# Patient Record
Sex: Female | Born: 1960 | Race: White | Hispanic: No | Marital: Married | Smoking: Never smoker
Health system: Southern US, Community
[De-identification: ages and names within clinical notes are randomized; demographics above are authoritative.]

## PROBLEM LIST (undated history)

## (undated) DIAGNOSIS — I10 Essential (primary) hypertension: Secondary | ICD-10-CM

## (undated) DIAGNOSIS — N6019 Diffuse cystic mastopathy of unspecified breast: Secondary | ICD-10-CM

## (undated) DIAGNOSIS — E669 Obesity, unspecified: Secondary | ICD-10-CM

## (undated) DIAGNOSIS — Z1211 Encounter for screening for malignant neoplasm of colon: Secondary | ICD-10-CM

## (undated) HISTORY — DX: Essential (primary) hypertension: I10

## (undated) HISTORY — PX: DILATION AND CURETTAGE OF UTERUS: SHX78

## (undated) HISTORY — DX: Obesity, unspecified: E66.9

## (undated) HISTORY — DX: Diffuse cystic mastopathy of unspecified breast: N60.19

## (undated) HISTORY — PX: TUBAL LIGATION: SHX77

## (undated) HISTORY — DX: Encounter for screening for malignant neoplasm of colon: Z12.11

## (undated) HISTORY — PX: BREAST CYST ASPIRATION: SHX578

---
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
## 1997-05-29 HISTORY — PX: OTHER SURGICAL HISTORY: SHX169

## 2002-04-28 HISTORY — PX: BREAST SURGERY: SHX581

## 2004-07-15 ENCOUNTER — Ambulatory Visit: Payer: Self-pay | Admitting: General Surgery

## 2005-07-27 ENCOUNTER — Ambulatory Visit: Payer: Self-pay | Admitting: General Surgery

## 2006-08-10 ENCOUNTER — Ambulatory Visit: Payer: Self-pay | Admitting: General Surgery

## 2006-08-14 ENCOUNTER — Ambulatory Visit: Payer: Self-pay | Admitting: General Surgery

## 2007-09-10 ENCOUNTER — Ambulatory Visit: Payer: Self-pay | Admitting: General Surgery

## 2008-09-23 ENCOUNTER — Ambulatory Visit: Payer: Self-pay | Admitting: General Surgery

## 2009-01-28 ENCOUNTER — Ambulatory Visit: Payer: Self-pay

## 2009-03-29 HISTORY — PX: ENDOMETRIAL BIOPSY: SHX622

## 2009-09-24 ENCOUNTER — Ambulatory Visit: Payer: Self-pay | Admitting: General Surgery

## 2010-05-29 DIAGNOSIS — N6019 Diffuse cystic mastopathy of unspecified breast: Secondary | ICD-10-CM

## 2010-05-29 HISTORY — DX: Diffuse cystic mastopathy of unspecified breast: N60.19

## 2010-06-23 ENCOUNTER — Encounter: Payer: Self-pay | Admitting: Physician Assistant

## 2010-06-29 ENCOUNTER — Encounter: Payer: Self-pay | Admitting: Physician Assistant

## 2010-07-28 ENCOUNTER — Encounter: Payer: Self-pay | Admitting: Physician Assistant

## 2010-08-28 ENCOUNTER — Encounter: Payer: Self-pay | Admitting: Physician Assistant

## 2010-09-27 ENCOUNTER — Ambulatory Visit: Payer: Self-pay | Admitting: General Surgery

## 2010-12-16 ENCOUNTER — Ambulatory Visit: Payer: Self-pay | Admitting: General Surgery

## 2010-12-16 HISTORY — PX: COLONOSCOPY: SHX174

## 2010-12-19 LAB — PATHOLOGY REPORT

## 2010-12-19 LAB — HM COLONOSCOPY: HM COLON: ABNORMAL — AB

## 2011-05-30 DIAGNOSIS — Z1211 Encounter for screening for malignant neoplasm of colon: Secondary | ICD-10-CM

## 2011-05-30 HISTORY — PX: COLONOSCOPY: SHX174

## 2011-05-30 HISTORY — DX: Encounter for screening for malignant neoplasm of colon: Z12.11

## 2011-09-28 ENCOUNTER — Ambulatory Visit: Payer: Self-pay | Admitting: General Surgery

## 2012-08-13 ENCOUNTER — Encounter: Payer: Self-pay | Admitting: *Deleted

## 2012-08-26 ENCOUNTER — Encounter: Payer: Self-pay | Admitting: General Surgery

## 2012-09-30 ENCOUNTER — Ambulatory Visit: Payer: Self-pay | Admitting: General Surgery

## 2012-10-01 ENCOUNTER — Encounter: Payer: Self-pay | Admitting: General Surgery

## 2012-10-23 ENCOUNTER — Ambulatory Visit: Payer: Self-pay | Admitting: General Surgery

## 2012-11-04 ENCOUNTER — Encounter: Payer: Self-pay | Admitting: General Surgery

## 2012-11-04 ENCOUNTER — Ambulatory Visit (INDEPENDENT_AMBULATORY_CARE_PROVIDER_SITE_OTHER): Payer: 59 | Admitting: General Surgery

## 2012-11-04 VITALS — BP 160/84 | HR 80 | Resp 18 | Ht 62.0 in

## 2012-11-04 DIAGNOSIS — N6019 Diffuse cystic mastopathy of unspecified breast: Secondary | ICD-10-CM

## 2012-11-04 NOTE — Patient Instructions (Addendum)
Patient to return in 1 year with bilateral screening mammogram. Patient to continue self breast checks.

## 2012-11-04 NOTE — Progress Notes (Signed)
Patient ID: Rachel Davidson, female   DOB: 03-08-1961, 52 y.o.   MRN: [id number]  Chief Complaint  Patient presents with  . Follow-up    mammogram follow up    HPI Rachel Davidson is a 52 y.o. female who presents for a follow up mammogram. The most recent mammogram was done on 09/30/12 with a birad category 2. The patient has had prior breast biopsy of the left breast in 2004 that was benign. No known family history of breast problems. The patient does do regular self breast checks and gets regular mammograms done. No new breast problems at this time. The patient has noticed some mild leg edema at times.  HPI  Past Medical History  Diagnosis Date  . Diffuse cystic mastopathy 2012  . Breast screening, unspecified 2012  . Special screening for malignant neoplasms, colon 2013  . Obesity, unspecified   . Screening for obesity 2013    Past Surgical History  Procedure Laterality Date  . Breast surgery Left 2004    biopsy  . Cesarean section  1996  . Tonsillectomy  1979  . Colonoscopy  2013    Dr. Mechele Collin    Family History  Problem Relation Age of Onset  . Cancer Father     lung    Social History History  Substance Use Topics  . Smoking status: Never Smoker   . Smokeless tobacco: Not on file  . Alcohol Use: No    No Known Allergies  Current Outpatient Prescriptions  Medication Sig Dispense Refill  . aspirin 81 MG tablet Take 81 mg by mouth daily.      . DiphenhydrAMINE HCl (BENADRYL ALLERGY PO) Take 1 tablet by mouth daily.       No current facility-administered medications for this visit.    Review of Systems Review of Systems  Constitutional: Negative.   Respiratory: Negative.   Cardiovascular: Negative.     Blood pressure 160/84, pulse 80, resp. rate 18, height 5\' 2"  (1.575 m), last menstrual period 07/12/2011.  Physical Exam Physical Exam  Constitutional: She is oriented to person, place, and time. She appears well-developed and well-nourished.  Eyes:  Conjunctivae are normal. No scleral icterus.  Neck: Trachea normal. No mass and no thyromegaly present.  Cardiovascular: Normal rate, regular rhythm, normal heart sounds and normal pulses.   No murmur heard. Pulses:      Dorsalis pedis pulses are 2+ on the right side, and 2+ on the left side.       Posterior tibial pulses are 2+ on the right side, and 2+ on the left side.  No edema noted at present. No varicose veins.  Pulmonary/Chest: Effort normal and breath sounds normal. Right breast exhibits no inverted nipple, no mass, no nipple discharge, no skin change and no tenderness. Left breast exhibits no inverted nipple, no mass, no nipple discharge, no skin change and no tenderness.  Lymphadenopathy:    She has no cervical adenopathy.    She has no axillary adenopathy.  Neurological: She is alert and oriented to person, place, and time.  Skin: Skin is warm and dry.    Data Reviewed  Mammogram reviewed  Assessment    Exam stable. No findings to account for her ankle edema at present.     Plan    1 year follow up screening bilateral mammogram.        SANKAR,SEEPLAPUTHUR G 11/04/2012, 12:57 PM

## 2013-03-17 ENCOUNTER — Other Ambulatory Visit: Payer: Self-pay | Admitting: Family Medicine

## 2013-03-17 LAB — COMPREHENSIVE METABOLIC PANEL
Albumin: 3.5 g/dL (ref 3.4–5.0)
Anion Gap: 3 — ABNORMAL LOW (ref 7–16)
BUN: 17 mg/dL (ref 7–18)
Creatinine: 0.82 mg/dL (ref 0.60–1.30)
Glucose: 126 mg/dL — ABNORMAL HIGH (ref 65–99)
Osmolality: 281 (ref 275–301)
SGOT(AST): 25 U/L (ref 15–37)
Sodium: 139 mmol/L (ref 136–145)

## 2013-06-11 ENCOUNTER — Ambulatory Visit (INDEPENDENT_AMBULATORY_CARE_PROVIDER_SITE_OTHER): Payer: 59 | Admitting: General Surgery

## 2013-06-11 ENCOUNTER — Encounter: Payer: Self-pay | Admitting: General Surgery

## 2013-06-11 VITALS — BP 152/92 | HR 76 | Temp 97.4°F | Resp 12 | Ht 62.0 in

## 2013-06-11 DIAGNOSIS — K610 Anal abscess: Secondary | ICD-10-CM

## 2013-06-11 DIAGNOSIS — K612 Anorectal abscess: Secondary | ICD-10-CM

## 2013-06-11 MED ORDER — AMOXICILLIN-POT CLAVULANATE 875-125 MG PO TABS: 1.0000 | ORAL_TABLET | Freq: Two times a day (BID) | ORAL | Status: DC

## 2013-06-11 NOTE — Patient Instructions (Signed)
Anal Fistula °An anal fistula is an abnormal tunnel that develops between the bowel and skin near the outside of the anus, where feces comes out. The anus has a number of tiny glands that make lubricating fluid. Sometimes these glands can become plugged and infected. This may lead to the development of a fluid-filled pocket (abscess). An anal fistula often develops after this infection or abscess. It is nearly always caused by a past or current anal abscess.  °CAUSES  °Though an anal fistula is almost always caused by a past or current anal abscess, other causes can include: °· A complication of surgery. °· Trauma to the rectal area. °· Radiation to the area. °· Other medical conditions or diseases, such as:   °· Chronic inflammatory bowel disease, such as Crohn disease or ulcerative colitis.   °· Colon or rectal cancer.   °· Diverticular disease, such as diverticulitis.   °· A sexually transmitted disease, such as gonorrhea, chlamydia, or syphilis. °· An HIV infection or AIDS.   °SYMPTOMS  °· Throbbing or constant pain that may be worse when sitting.   °· Swelling or irritation around the anus.   °· Drainage of pus or blood from an opening near the anus.   °· Pain with bowel movements. °· Fever or chills. °DIAGNOSIS  °Your caregiver will examine the area to find the openings of the anal fistula and the fistula tract. The external opening of the anal fistula may be seen during a physical examination. Other examinations that may be performed include:  °· Examination of the rectal area with a gloved hand (digital rectal exam).   °· Examination with a probe or scope to help locate the internal opening of the fistula.   °· Injection of a dye into the fistula opening. X-rays can be taken to find the exact location and path of the fistula.   °· An MRI or ultrasound of the anal area.   °Other tests may be performed to find the cause of the anal fistula.    °TREATMENT  °The most common treatment for an anal fistula is  surgery. There are different surgery options depending on where your fistula is located and how complex the fistula is. Surgical options include: °· A fistulotomy. This surgery involves opening up the whole fistula and draining the contents inside to promote healing. °· Seton placement. A silk string (seton) is placed into the fistula during a fistulotomy to drain any infection to promote healing. °· Advancement flap procedure. Tissue is removed from your rectum or the skin around the anus and is attached to the opening of the fistula. °· Bioprosthetic plug. A cone-shaped plug is made from your tissue and is used to block the opening of the fistula. °Some anal fistulas do not require surgery. A fibrin glue is a non-surgical option that involves injecting the glue to seal the fistula. You also may be prescribed an antibiotic medicine to treat an infection.  °HOME CARE INSTRUCTIONS  °· Take your antibiotics as directed. Finish them even if you start to feel better. °· Only take over-the-counter or prescription medicines as directed by your caregiver. Use a stool softener or laxative, if recommended.   °· Eat a high-fiber diet to help avoid constipation or as directed by your caregiver. °· Drink enough water to keep your urine clear or pale yellow.   °· A warm sitz bath may be soothing and help with healing. You may take warm sitz baths for 15 20 minutes, 3 4 times a day to ease pain and discomfort.   °· Follow excellent hygiene to keep the   anal area as clean and dry as possible. Use wet toilet paper or moist towelettes after each bowel movement.   °SEEK MEDICAL CARE IF: °You have increased pain not controlled with medicines.  °SEEK IMMEDIATE MEDICAL CARE IF: °· You have severe, intolerable pain. °· You have new swelling, redness, or discharge around the anal area. °· You have tenderness or warmth around the anal area. °· You have chills or diarrhea. °· You have severe problems urinating or having a bowel movement.    °· You have a fever or persistent symptoms for more than 2 3 days.   °· You have a fever and your symptoms suddenly get worse.   °MAKE SURE YOU:  °· Understand these instructions. °· Will watch your condition. °· Will get help right away if you are not doing well or get worse. °Document Released: 04/27/2008 Document Revised: 05/01/2012 Document Reviewed: 03/20/2011 °[employer]® Patient Information ©2014 [employer]. ° °

## 2013-06-11 NOTE — Progress Notes (Signed)
Patient ID: Rachel Davidson, female   DOB: 1960/11/28, 53 y.o.   MRN: [id number]  Chief Complaint  Patient presents with  . Other    boil near anus    HPI Rachel Davidson is a 53 y.o. female.  Here today for evaluation of a possible boil near anus area.  States she noticed it Friday(06/02/13)-it drained some but got worse again today.  HPI  Past Medical History  Diagnosis Date  . Diffuse cystic mastopathy 2012  . Breast screening, unspecified 2012  . Special screening for malignant neoplasms, colon 2013  . Obesity, unspecified   . Screening for obesity 2013    Past Surgical History  Procedure Laterality Date  . Breast surgery Left 2004    biopsy  . Cesarean section  1996  . Tonsillectomy  1979  . Colonoscopy  2013    Dr. Mechele CollinElliott    Family History  Problem Relation Age of Onset  . Cancer Father     lung    Social History History  Substance Use Topics  . Smoking status: Never Smoker   . Smokeless tobacco: Not on file  . Alcohol Use: No    No Known Allergies  Current Outpatient Prescriptions  Medication Sig Dispense Refill  . amoxicillin-clavulanate (AUGMENTIN) 875-125 MG per tablet Take 1 tablet by mouth 2 (two) times daily.  10 tablet  0  . aspirin 81 MG tablet Take 81 mg by mouth daily.      . DiphenhydrAMINE HCl (BENADRYL ALLERGY PO) Take 1 tablet by mouth as needed.        No current facility-administered medications for this visit.    Review of Systems Review of Systems  Constitutional: Negative.   Respiratory: Negative.     Blood pressure 152/92, pulse 76, temperature 97.4 F (36.3 C), temperature source Oral, resp. rate 12, height 5\' 2"  (1.575 m), last menstrual period 07/12/2011.  Physical Exam Physical Exam 2cm raised red, tender area 5 ocl perianal. No drainage. Pustule lkike areas on surface. Data Reviewed    Assessment    Perianal abscess     Plan    Drainage completed with her consent.     Patient was placed in left lateral  position. Anal area prepped with Betadine. 1 mL 1% Xylocaine was instilled. A radial incision was made over the abscess and a small ellipse of skin excised. About 1 mL of pus was evacuated. Dressed with dry gauze. Procedure tolerated with minimal discomfort.  Will recheck in 2-3 weeks. Rx sent for Augmentin 875 mg twice a day for 5 days   SANKAR,SEEPLAPUTHUR G 06/13/2013, 7:58 AM

## 2013-06-12 ENCOUNTER — Other Ambulatory Visit: Payer: Self-pay | Admitting: *Deleted

## 2013-06-12 DIAGNOSIS — K610 Anal abscess: Secondary | ICD-10-CM

## 2013-06-12 MED ORDER — AMOXICILLIN-POT CLAVULANATE 875-125 MG PO TABS: 1.0000 | ORAL_TABLET | Freq: Two times a day (BID) | ORAL | Status: AC

## 2013-06-13 ENCOUNTER — Encounter: Payer: Self-pay | Admitting: General Surgery

## 2013-06-30 ENCOUNTER — Encounter: Payer: Self-pay | Admitting: General Surgery

## 2013-06-30 ENCOUNTER — Ambulatory Visit (INDEPENDENT_AMBULATORY_CARE_PROVIDER_SITE_OTHER): Payer: 59 | Admitting: General Surgery

## 2013-06-30 VITALS — BP 140/82 | HR 72 | Resp 12 | Ht 62.0 in

## 2013-06-30 DIAGNOSIS — K610 Anal abscess: Secondary | ICD-10-CM

## 2013-06-30 DIAGNOSIS — K612 Anorectal abscess: Secondary | ICD-10-CM

## 2013-06-30 NOTE — Progress Notes (Signed)
Patient ID: Rachel Davidson, female   DOB: May 08, 1961, 53 y.o.   MRN: [id number]   The patient presents for a follow up peri anal abscess. The patient states she is doing much better. She denies any drainage at this time.   Patient to return in 1 month for follow up.   Swelling has subsided in the anal area. No active drainage.

## 2013-06-30 NOTE — Patient Instructions (Signed)
Patient to return in 1 month for follow up.  

## 2013-07-29 ENCOUNTER — Encounter: Payer: Self-pay | Admitting: General Surgery

## 2013-07-29 ENCOUNTER — Ambulatory Visit (INDEPENDENT_AMBULATORY_CARE_PROVIDER_SITE_OTHER): Payer: 59 | Admitting: General Surgery

## 2013-07-29 VITALS — BP 142/90 | HR 72 | Resp 12 | Ht 62.0 in

## 2013-07-29 DIAGNOSIS — K612 Anorectal abscess: Secondary | ICD-10-CM

## 2013-07-29 DIAGNOSIS — K610 Anal abscess: Secondary | ICD-10-CM

## 2013-07-29 NOTE — Patient Instructions (Addendum)
Patient to return as scheduled. Call for any problems.

## 2013-07-29 NOTE — Progress Notes (Signed)
Patient ID: Rachel Davidson, female   DOB: 07/27/1960, 53 y.o.   MRN: [id number]   The patient presents for a 1 month follow up perianal abscess. The patient denies any new problems at this time.   Anal exam shows no sign of fistula. No swelling or tenderness. Appears the abscess has resolved fully.

## 2013-11-17 ENCOUNTER — Ambulatory Visit: Payer: Self-pay | Admitting: General Surgery

## 2013-11-18 ENCOUNTER — Encounter: Payer: Self-pay | Admitting: General Surgery

## 2013-11-26 ENCOUNTER — Ambulatory Visit (INDEPENDENT_AMBULATORY_CARE_PROVIDER_SITE_OTHER): Payer: 59 | Admitting: General Surgery

## 2013-11-26 ENCOUNTER — Encounter: Payer: Self-pay | Admitting: General Surgery

## 2013-11-26 VITALS — BP 140/100 | HR 80 | Resp 16 | Ht 62.0 in

## 2013-11-26 DIAGNOSIS — N6019 Diffuse cystic mastopathy of unspecified breast: Secondary | ICD-10-CM

## 2013-11-26 NOTE — Patient Instructions (Signed)
Patient to return in 1 year with bilateral screening mammogram. Continue self breast exams. Call office for any new breast issues or concerns.  

## 2013-11-26 NOTE — Progress Notes (Signed)
Patient ID: Rachel Davidson, female   DOB: [birth year], 53 y.o.   MRN: [id number]  Chief Complaint  Patient presents with  . Follow-up    1 year follow up screening mammogram    HPI Rachel CarinaMarcella K Mcafee is a 53 y.o. female who presents for a breast evaluation. The most recent mammogram was done on 11/17/13. Patient does perform regular self breast checks and gets regular mammograms done. The patient denies any new problems with the breasts at this time.     HPI  Past Medical History  Diagnosis Date  . Diffuse cystic mastopathy 2012  . Breast screening, unspecified 2012  . Special screening for malignant neoplasms, colon 2013  . Obesity, unspecified   . Screening for obesity 2013    Past Surgical History  Procedure Laterality Date  . Breast surgery Left 2004    biopsy  . Cesarean section  1996  . Tonsillectomy  1979  . Colonoscopy  2013    Dr. Mechele CollinElliott    Family History  Problem Relation Age of Onset  . Cancer Father     lung    Social History History  Substance Use Topics  . Smoking status: Never Smoker   . Smokeless tobacco: Not on file  . Alcohol Use: No    No Known Allergies  Current Outpatient Prescriptions  Medication Sig Dispense Refill  . aspirin 81 MG tablet Take 81 mg by mouth daily.      . DiphenhydrAMINE HCl (BENADRYL ALLERGY PO) Take 1 tablet by mouth as needed.        No current facility-administered medications for this visit.    Review of Systems Review of Systems  Constitutional: Negative.   Respiratory: Negative.   Cardiovascular: Negative.     Blood pressure 140/100, pulse 80, resp. rate 16, height 5\' 2"  (1.575 m), weight 0 lb (0 kg), last menstrual period 07/12/2011.  Physical Exam Physical Exam  Constitutional: She is oriented to person, place, and time. She appears well-developed and well-nourished.  Eyes: Conjunctivae are normal. No scleral icterus.  Neck: Neck supple. No thyromegaly present.  Cardiovascular: Normal rate, regular  rhythm and normal heart sounds.   No murmur heard. Pulmonary/Chest: Effort normal and breath sounds normal. Right breast exhibits no inverted nipple, no mass, no nipple discharge, no skin change and no tenderness. Left breast exhibits no inverted nipple, no mass, no nipple discharge, no skin change and no tenderness.  Abdominal: Soft. Normal appearance and bowel sounds are normal. There is no hepatosplenomegaly. There is no tenderness. No hernia.  Lymphadenopathy:    She has no cervical adenopathy.    She has no axillary adenopathy.  Neurological: She is alert and oriented to person, place, and time.  Skin: Skin is warm and dry.    Data Reviewed  Mammogram reviewed and stable.   Assessment    Stable exam. FCD with multiple cyst aspirations in past     Plan    Patient to return in 1 year with bilateral screening mammogram.        Anniebelle Devore G 11/27/2013, 7:17 AM

## 2013-11-27 ENCOUNTER — Encounter: Payer: Self-pay | Admitting: General Surgery

## 2014-03-25 ENCOUNTER — Ambulatory Visit: Payer: Self-pay | Admitting: Family Medicine

## 2014-03-25 LAB — CHOLESTEROL, TOTAL: CHOLESTEROL: 223 mg/dL — AB (ref 0–200)

## 2014-03-25 LAB — HDL CHOLESTEROL: HDL: 47 mg/dL (ref 40–60)

## 2014-03-25 LAB — TSH: Thyroid Stimulating Horm: 3.36 u[IU]/mL

## 2014-03-30 ENCOUNTER — Encounter: Payer: Self-pay | Admitting: General Surgery

## 2014-05-13 ENCOUNTER — Encounter: Payer: Self-pay | Admitting: General Surgery

## 2014-10-07 ENCOUNTER — Other Ambulatory Visit: Payer: Self-pay

## 2014-10-07 DIAGNOSIS — Z1231 Encounter for screening mammogram for malignant neoplasm of breast: Secondary | ICD-10-CM

## 2014-10-28 ENCOUNTER — Ambulatory Visit (INDEPENDENT_AMBULATORY_CARE_PROVIDER_SITE_OTHER): Payer: 59 | Admitting: Family Medicine

## 2014-10-28 ENCOUNTER — Encounter: Payer: Self-pay | Admitting: Family Medicine

## 2014-10-28 VITALS — BP 128/84 | HR 78 | Temp 98.5°F | Resp 14 | Ht 62.0 in | Wt 203.0 lb

## 2014-10-28 DIAGNOSIS — I10 Essential (primary) hypertension: Secondary | ICD-10-CM | POA: Diagnosis not present

## 2014-10-28 DIAGNOSIS — E8881 Metabolic syndrome: Secondary | ICD-10-CM | POA: Insufficient documentation

## 2014-10-28 DIAGNOSIS — G629 Polyneuropathy, unspecified: Secondary | ICD-10-CM | POA: Insufficient documentation

## 2014-10-28 DIAGNOSIS — E669 Obesity, unspecified: Secondary | ICD-10-CM | POA: Insufficient documentation

## 2014-10-28 DIAGNOSIS — N951 Menopausal and female climacteric states: Secondary | ICD-10-CM | POA: Insufficient documentation

## 2014-10-28 NOTE — Patient Instructions (Signed)
RTO IN 6 MO 

## 2014-10-28 NOTE — Progress Notes (Signed)
Name: Rachel Davidson   MRN: [id number]    DOB: 1961/05/22   Date:10/28/2014       Progress Note  Subjective  Chief Complaint  Chief Complaint  Patient presents with  . Hypertension    Hypertension This is a chronic problem. The current episode started more than 1 year ago. The problem is unchanged. The problem is controlled. Associated symptoms include palpitations. Pertinent negatives include no anxiety, blurred vision, chest pain, malaise/fatigue, orthopnea, peripheral edema or shortness of breath. There are no associated agents to hypertension. Risk factors for coronary artery disease include dyslipidemia. Past treatments include diuretics. Compliance problems include exercise.     No problem-specific assessment & plan notes found for this encounter.   Past Medical History  Diagnosis Date  . Diffuse cystic mastopathy 2012  . Breast screening, unspecified 2012  . Special screening for malignant neoplasms, colon 2013  . Obesity, unspecified   . Screening for obesity 2013    Past Surgical History  Procedure Laterality Date  . Breast surgery Left 2004    biopsy  . Cesarean section  1996  . Tonsillectomy  1979  . Colonoscopy  2013    Dr. Mechele CollinElliott    Family History  Problem Relation Age of Onset  . Cancer Father     lung    History   Social History  . Marital Status: Married    Spouse Name: N/A  . Number of Children: N/A  . Years of Education: N/A   Occupational History  . Not on file.   Social History Main Topics  . Smoking status: Never Smoker   . Smokeless tobacco: Not on file  . Alcohol Use: No  . Drug Use: No  . Sexual Activity: Not on file   Other Topics Concern  . Not on file   Social History Narrative     Current outpatient prescriptions:  .  triamterene-hydrochlorothiazide (MAXZIDE-25) 37.5-25 MG per tablet, Take 1 tablet by mouth daily., Disp: , Rfl:  .  aspirin 81 MG tablet, Take 81 mg by mouth daily., Disp: , Rfl:  .  DiphenhydrAMINE  HCl (BENADRYL ALLERGY PO), Take 1 tablet by mouth as needed. , Disp: , Rfl:   No Known Allergies   Review of Systems  Constitutional: Positive for weight loss. Negative for malaise/fatigue.  HENT: Negative for nosebleeds and tinnitus.   Eyes: Negative for blurred vision.  Respiratory: Negative for cough and shortness of breath.   Cardiovascular: Positive for palpitations. Negative for chest pain, orthopnea, claudication and leg swelling.  Skin: Negative for rash.  Neurological: Negative for dizziness and tremors.  Endo/Heme/Allergies: Does not bruise/bleed easily.  Psychiatric/Behavioral: Negative for depression and substance abuse. The patient is not nervous/anxious and does not have insomnia.       Objective  Filed Vitals:   10/28/14 1132  BP: 128/84  Pulse: 78  Temp: 98.5 F (36.9 C)  Resp: 14  Height: 5\' 2"  (1.575 m)  Weight: 203 lb (92.08 kg)  SpO2: 97%    Physical Exam  Constitutional: She is oriented to person, place, and time and well-developed, well-nourished, and in no distress. No distress.  Neck: Neck supple.  Cardiovascular: Normal rate, regular rhythm and normal heart sounds.   Pulses:      Dorsalis pedis pulses are 2+ on the right side, and 2+ on the left side.       Posterior tibial pulses are 2+ on the right side, and 2+ on the left side.  No edema  Pulmonary/Chest: Effort normal and breath sounds normal.  Lymphadenopathy:    She has no cervical adenopathy.  Neurological: She is alert and oriented to person, place, and time.  Skin: Skin is warm and dry. She is not diaphoretic. No erythema.      No results found for this or any previous visit (from the past 2160 hour(s)).   Assessment & Plan  Problem List Items Addressed This Visit    BP (high blood pressure) - Primary   Relevant Medications   triamterene-hydrochlorothiazide (MAXZIDE-25) 37.5-25 MG per tablet   Other Relevant Orders   Comprehensive metabolic panel   TSH      Meds  ordered this encounter  Medications  . triamterene-hydrochlorothiazide (MAXZIDE-25) 37.5-25 MG per tablet    Sig: Take 1 tablet by mouth daily.

## 2014-10-30 ENCOUNTER — Other Ambulatory Visit
Admission: RE | Admit: 2014-10-30 | Discharge: 2014-10-30 | Disposition: A | Discharge status: Home / Self Care | Payer: 59 | Source: Ambulatory Visit | Attending: Family Medicine | Admitting: Family Medicine

## 2014-10-30 DIAGNOSIS — E785 Hyperlipidemia, unspecified: Secondary | ICD-10-CM | POA: Diagnosis not present

## 2014-10-30 DIAGNOSIS — I10 Essential (primary) hypertension: Secondary | ICD-10-CM | POA: Insufficient documentation

## 2014-10-30 LAB — TSH: TSH: 2.771 u[IU]/mL (ref 0.350–4.500)

## 2014-10-30 LAB — COMPREHENSIVE METABOLIC PANEL
ALBUMIN: 4 g/dL (ref 3.5–5.0)
ALK PHOS: 94 U/L (ref 38–126)
ALT: 27 U/L (ref 14–54)
ANION GAP: 11 (ref 5–15)
AST: 25 U/L (ref 15–41)
BILIRUBIN TOTAL: 1.3 mg/dL — AB (ref 0.3–1.2)
BUN: 23 mg/dL — AB (ref 6–20)
CO2: 29 mmol/L (ref 22–32)
CREATININE: 0.91 mg/dL (ref 0.44–1.00)
Calcium: 9.4 mg/dL (ref 8.9–10.3)
Chloride: 101 mmol/L (ref 101–111)
GFR calc Af Amer: >60 mL/min (ref >60)
GFR calc non Af Amer: >60 mL/min (ref >60)
Glucose, Bld: 111 mg/dL — ABNORMAL HIGH (ref 65–99)
Potassium: 3.5 mmol/L (ref 3.5–5.1)
SODIUM: 141 mmol/L (ref 135–145)
TOTAL PROTEIN: 6.9 g/dL (ref 6.5–8.1)

## 2014-11-03 ENCOUNTER — Telehealth: Payer: Self-pay | Admitting: Family Medicine

## 2014-11-03 ENCOUNTER — Encounter: Payer: Self-pay | Admitting: Emergency Medicine

## 2014-11-03 DIAGNOSIS — M79673 Pain in unspecified foot: Secondary | ICD-10-CM

## 2014-11-03 NOTE — Progress Notes (Signed)
Patient walked into office and was notified of lab results

## 2014-11-03 NOTE — Progress Notes (Signed)
Refer to Dr. Ernest PineHooten for foot pain

## 2014-11-03 NOTE — Progress Notes (Signed)
Refer Dr Ernest PineHooten for foot pain

## 2014-11-03 NOTE — Telephone Encounter (Signed)
Pt is checking status on her referral appointment. She is requesting that you try to work on this today because she is in a lot of pain.

## 2014-11-03 NOTE — Progress Notes (Signed)
Patient came by today having right foot and ankle pain. Would like a referral to Dr. Ernest PineHooten

## 2014-11-03 NOTE — Addendum Note (Signed)
Addended by: Dennison MascotMORRISEY, Jacole Capley on: 11/03/2014 10:46 AM   Modules accepted: Orders

## 2014-11-04 NOTE — Telephone Encounter (Signed)
Referral has been made, along with appointment, patient has been notified.

## 2014-11-11 NOTE — Telephone Encounter (Signed)
Errenous °

## 2014-11-16 ENCOUNTER — Ambulatory Visit
Admission: RE | Admit: 2014-11-16 | Discharge: 2014-11-16 | Disposition: A | Discharge status: Home / Self Care | Payer: 59 | Source: Ambulatory Visit | Attending: General Surgery | Admitting: General Surgery

## 2014-11-16 DIAGNOSIS — Z1231 Encounter for screening mammogram for malignant neoplasm of breast: Secondary | ICD-10-CM | POA: Insufficient documentation

## 2014-11-19 ENCOUNTER — Other Ambulatory Visit: Payer: Self-pay | Admitting: Family Medicine

## 2014-11-20 ENCOUNTER — Ambulatory Visit: Payer: Self-pay

## 2014-11-23 ENCOUNTER — Encounter: Payer: Self-pay | Admitting: General Surgery

## 2014-11-23 ENCOUNTER — Ambulatory Visit: Payer: 59 | Admitting: General Surgery

## 2014-11-23 ENCOUNTER — Ambulatory Visit (INDEPENDENT_AMBULATORY_CARE_PROVIDER_SITE_OTHER): Payer: 59 | Admitting: General Surgery

## 2014-11-23 VITALS — BP 126/74 | HR 74 | Resp 12 | Ht 62.0 in

## 2014-11-23 DIAGNOSIS — N6019 Diffuse cystic mastopathy of unspecified breast: Secondary | ICD-10-CM

## 2014-11-23 NOTE — Patient Instructions (Signed)
The patient has been asked to return to the office in one year with a bilateral screening mammogram. 

## 2014-11-23 NOTE — Progress Notes (Signed)
Patient ID: Rachel Davidson, female   DOB: 16-Jul-1960, 54 y.o.   MRN: [id number]  Chief Complaint  Patient presents with  . Follow-up    mammogram    HPI Rachel Davidson is a 54 y.o. female who presents for a breast evaluation. The most recent mammogram was done on 11/18/14.  Patient does perform regular self breast checks and gets regular mammograms done.    HPI  Past Medical History  Diagnosis Date  . Diffuse cystic mastopathy 2012  . Breast screening, unspecified 2012  . Special screening for malignant neoplasms, colon 2013  . Obesity, unspecified   . Screening for obesity 2013    Past Surgical History  Procedure Laterality Date  . Breast surgery Left 2004    biopsy  . Cesarean section  1996  . Tonsillectomy  1979  . Colonoscopy  2013    Dr. Mechele Collin  . Breast biopsy Left     negative 2005  . Breast cyst aspiration Left     2012    Family History  Problem Relation Age of Onset  . Cancer Father     lung    Social History History  Substance Use Topics  . Smoking status: Never Smoker   . Smokeless tobacco: Not on file  . Alcohol Use: No    No Known Allergies  Current Outpatient Prescriptions  Medication Sig Dispense Refill  . aspirin 81 MG tablet Take 81 mg by mouth daily.    . DiphenhydrAMINE HCl (BENADRYL ALLERGY PO) Take 1 tablet by mouth as needed.     . triamterene-hydrochlorothiazide (MAXZIDE-25) 37.5-25 MG per tablet TAKE ONE TABLET BY MOUTH DAILY 90 tablet 1   No current facility-administered medications for this visit.    Review of Systems Review of Systems  Constitutional: Negative.   Respiratory: Negative.   Cardiovascular: Negative.     Blood pressure 126/74, pulse 74, resp. rate 12, height 5\' 2"  (1.575 m), last menstrual period 07/12/2011.  Physical Exam Physical Exam  Constitutional: She appears well-developed and well-nourished.  Eyes: Conjunctivae are normal. No scleral icterus.  Neck: Neck supple.  Cardiovascular: Normal  rate, regular rhythm and normal heart sounds.   Pulmonary/Chest: Effort normal and breath sounds normal. Right breast exhibits no inverted nipple, no mass, no nipple discharge, no skin change and no tenderness. Left breast exhibits no inverted nipple, no mass, no nipple discharge, no skin change and no tenderness.  Abdominal: Bowel sounds are normal. There is no tenderness.  Lymphadenopathy:    She has no cervical adenopathy.    She has no axillary adenopathy.  Neurological: She is alert.  Skin: Skin is warm and dry.    Data Reviewed Mammogram reviewed and stable  Assessment    Fibrocystic disease of breast. History of multiple cyst aspirations in the past. Stable exam and mammogram.      Plan    The patient has been asked to return to the office in one year with a bilateral screening  mammogram.      PCP: Dennison Mascot, MD  Gerlene Burdock G 11/23/2014, 11:31 AM

## 2014-12-02 ENCOUNTER — Ambulatory Visit: Payer: 59 | Admitting: General Surgery

## 2015-05-03 ENCOUNTER — Ambulatory Visit (INDEPENDENT_AMBULATORY_CARE_PROVIDER_SITE_OTHER): Payer: 59 | Admitting: Family Medicine

## 2015-05-03 ENCOUNTER — Encounter: Payer: Self-pay | Admitting: Family Medicine

## 2015-05-03 VITALS — BP 118/78 | HR 103 | Temp 98.1°F | Resp 18 | Ht 62.0 in | Wt 206.5 lb

## 2015-05-03 DIAGNOSIS — E669 Obesity, unspecified: Secondary | ICD-10-CM | POA: Diagnosis not present

## 2015-05-03 DIAGNOSIS — E8881 Metabolic syndrome: Secondary | ICD-10-CM

## 2015-05-03 DIAGNOSIS — I10 Essential (primary) hypertension: Secondary | ICD-10-CM | POA: Diagnosis not present

## 2015-05-03 LAB — GLUCOSE, POCT (MANUAL RESULT ENTRY): POC Glucose: 163 mg/dl — AB (ref 70–99)

## 2015-05-03 LAB — POCT GLYCOSYLATED HEMOGLOBIN (HGB A1C): Hemoglobin A1C: 6

## 2015-05-03 NOTE — Progress Notes (Signed)
Name: Rachel Davidson   MRN: [id number]    DOB: 1961/04/30   Date:05/03/2015       Progress Note  Subjective  Chief Complaint  Chief Complaint  Patient presents with  . Obesity  . Hypertension    HPI  Hypertension   Patient presents for follow-up of hypertension. It has been present for over 5 years.  Patient states that there is compliance with medical regimen which consists of Maxide . There is no end organ disease. Cardiac risk factors include hypertension hyperlipidemia and diabetes.  Exercise regimen consist of some walking .  Diet consist of several junk foods  Obesity  Patient has a history of obesity for over 5 years.  Attempts at weight loss have included diet and exercise and dietary consult in the past .  Results of this regimen  have been less than remarkable .  Patient not seriously interested in weight reduction has gained 3 pounds since last visit. . .  Past Medical History  Diagnosis Date  . Diffuse cystic mastopathy 2012  . Breast screening, unspecified 2012  . Special screening for malignant neoplasms, colon 2013  . Obesity, unspecified   . Screening for obesity 2013    Social History  Substance Use Topics  . Smoking status: Never Smoker   . Smokeless tobacco: Not on file  . Alcohol Use: No     Current outpatient prescriptions:  .  aspirin 81 MG tablet, Take 81 mg by mouth daily., Disp: , Rfl:  .  DiphenhydrAMINE HCl (BENADRYL ALLERGY PO), Take 1 tablet by mouth as needed. , Disp: , Rfl:  .  triamterene-hydrochlorothiazide (MAXZIDE-25) 37.5-25 MG per tablet, TAKE ONE TABLET BY MOUTH DAILY, Disp: 90 tablet, Rfl: 1  No Known Allergies  Review of Systems  Constitutional: Negative for fever, chills and weight loss.       Obese in no acute distress  HENT: Negative for congestion, hearing loss, sore throat and tinnitus.   Eyes: Negative for blurred vision, double vision and redness.  Respiratory: Negative for cough, hemoptysis and shortness of breath.    Cardiovascular: Negative for chest pain, palpitations, orthopnea, claudication and leg swelling.  Gastrointestinal: Negative for heartburn, nausea, vomiting, diarrhea, constipation and blood in stool.  Genitourinary: Negative for dysuria, urgency, frequency and hematuria.  Musculoskeletal: Negative for myalgias, back pain, joint pain, falls and neck pain.  Skin: Negative for itching.  Neurological: Negative for dizziness, tingling, tremors, focal weakness, seizures, loss of consciousness, weakness and headaches.  Endo/Heme/Allergies: Does not bruise/bleed easily.  Psychiatric/Behavioral: Negative for depression and substance abuse. The patient is not nervous/anxious and does not have insomnia.      Objective  Filed Vitals:   05/03/15 0806  BP: 118/78  Pulse: 103  Temp: 98.1 F (36.7 C)  Resp: 18  Height: 5\' 2"  (1.575 m)  Weight: 206 lb 8 oz (93.668 kg)  SpO2: 98%     Physical Exam  Constitutional: She is oriented to person, place, and time.  Obese but in no acute distress  HENT:  Head: Normocephalic.  Eyes: EOM are normal. Pupils are equal, round, and reactive to light.  Neck: Normal range of motion. No thyromegaly present.  Cardiovascular: Normal rate, regular rhythm and normal heart sounds.   No murmur heard. Pulmonary/Chest: Effort normal and breath sounds normal.  Musculoskeletal: Normal range of motion. She exhibits no edema.  Neurological: She is alert and oriented to person, place, and time. No cranial nerve deficit. Gait normal.  Skin: Skin is warm  and dry. No rash noted.  Psychiatric: Memory and affect normal.      Assessment & Plan  1. Essential hypertension Well-controlled  2. Dysmetabolic syndrome Check glucose and A1c - POCT HgB A1C - POCT Glucose (CBG) - Comprehensive Metabolic Panel (CMET) - Lipid Profile - TSH  3. 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

## 2015-05-04 ENCOUNTER — Other Ambulatory Visit
Admission: RE | Admit: 2015-05-04 | Discharge: 2015-05-04 | Disposition: A | Discharge status: Home / Self Care | Payer: 59 | Source: Ambulatory Visit | Attending: Family Medicine | Admitting: Family Medicine

## 2015-05-04 DIAGNOSIS — E8881 Metabolic syndrome: Secondary | ICD-10-CM | POA: Insufficient documentation

## 2015-05-04 LAB — LIPID PANEL
CHOLESTEROL: 207 mg/dL — AB (ref 0–200)
HDL: 49 mg/dL (ref >40)
LDL Cholesterol: 137 mg/dL — ABNORMAL HIGH (ref 0–99)
Total CHOL/HDL Ratio: 4.2 RATIO
Triglycerides: 106 mg/dL (ref <150)
VLDL: 21 mg/dL (ref 0–40)

## 2015-05-04 LAB — TSH: TSH: 2.286 u[IU]/mL (ref 0.350–4.500)

## 2015-05-04 LAB — COMPREHENSIVE METABOLIC PANEL
ALT: 32 U/L (ref 14–54)
AST: 22 U/L (ref 15–41)
Albumin: 4.2 g/dL (ref 3.5–5.0)
Alkaline Phosphatase: 87 U/L (ref 38–126)
Anion gap: 7 (ref 5–15)
BILIRUBIN TOTAL: 1.6 mg/dL — AB (ref 0.3–1.2)
BUN: 30 mg/dL — AB (ref 6–20)
CALCIUM: 8.8 mg/dL — AB (ref 8.9–10.3)
CO2: 29 mmol/L (ref 22–32)
CREATININE: 0.78 mg/dL (ref 0.44–1.00)
Chloride: 99 mmol/L — ABNORMAL LOW (ref 101–111)
GFR calc Af Amer: >60 mL/min (ref >60)
Glucose, Bld: 119 mg/dL — ABNORMAL HIGH (ref 65–99)
Potassium: 3.6 mmol/L (ref 3.5–5.1)
Sodium: 135 mmol/L (ref 135–145)
TOTAL PROTEIN: 7 g/dL (ref 6.5–8.1)

## 2015-05-10 DIAGNOSIS — I10 Essential (primary) hypertension: Secondary | ICD-10-CM | POA: Insufficient documentation

## 2015-06-28 DIAGNOSIS — Z124 Encounter for screening for malignant neoplasm of cervix: Secondary | ICD-10-CM | POA: Diagnosis not present

## 2015-06-28 DIAGNOSIS — Z01419 Encounter for gynecological examination (general) (routine) without abnormal findings: Secondary | ICD-10-CM | POA: Diagnosis not present

## 2015-06-28 DIAGNOSIS — N912 Amenorrhea, unspecified: Secondary | ICD-10-CM | POA: Diagnosis not present

## 2015-06-28 LAB — HM PAP SMEAR: HM PAP: NORMAL

## 2015-08-31 ENCOUNTER — Other Ambulatory Visit: Payer: Self-pay | Admitting: *Deleted

## 2015-08-31 DIAGNOSIS — Z1231 Encounter for screening mammogram for malignant neoplasm of breast: Secondary | ICD-10-CM

## 2015-09-02 ENCOUNTER — Encounter: Payer: Self-pay | Admitting: *Deleted

## 2015-11-01 ENCOUNTER — Ambulatory Visit: Payer: 59 | Admitting: Family Medicine

## 2015-11-17 ENCOUNTER — Other Ambulatory Visit: Payer: Self-pay | Admitting: General Surgery

## 2015-11-17 ENCOUNTER — Ambulatory Visit
Admission: RE | Admit: 2015-11-17 | Discharge: 2015-11-17 | Disposition: A | Discharge status: Home / Self Care | Payer: 59 | Source: Ambulatory Visit | Attending: General Surgery | Admitting: General Surgery

## 2015-11-17 DIAGNOSIS — Z1231 Encounter for screening mammogram for malignant neoplasm of breast: Secondary | ICD-10-CM | POA: Insufficient documentation

## 2015-11-25 ENCOUNTER — Ambulatory Visit: Payer: 59 | Admitting: General Surgery

## 2015-12-02 ENCOUNTER — Encounter: Payer: Self-pay | Admitting: General Surgery

## 2015-12-02 ENCOUNTER — Ambulatory Visit (INDEPENDENT_AMBULATORY_CARE_PROVIDER_SITE_OTHER): Payer: 59 | Admitting: General Surgery

## 2015-12-02 VITALS — BP 130/66 | HR 72 | Resp 14 | Ht 62.0 in

## 2015-12-02 DIAGNOSIS — R222 Localized swelling, mass and lump, trunk: Secondary | ICD-10-CM | POA: Diagnosis not present

## 2015-12-02 DIAGNOSIS — N6019 Diffuse cystic mastopathy of unspecified breast: Secondary | ICD-10-CM | POA: Diagnosis not present

## 2015-12-02 NOTE — Patient Instructions (Signed)
Continue self breast exams. Call office for any new breast issues or concerns. 

## 2015-12-02 NOTE — Progress Notes (Signed)
Patient ID: Rachel Davidson, female   DOB: 1961-03-15, 55 y.o.   MRN: [id number]  Chief Complaint  Patient presents with  . Follow-up    mammogram    HPI Rachel Davidson is a 55 y.o. female who presents for a breast evaluation. The most recent mammogram was done on 11/17/15 . Patient does perform regular self breast checks and gets regular mammograms done.  No new breast issues.  I have reviewed the history of present illness with the patient.   HPI  Past Medical History  Diagnosis Date  . Diffuse cystic mastopathy 2012  . Breast screening, unspecified 2012  . Special screening for malignant neoplasms, colon 2013  . Obesity, unspecified   . Screening for obesity 2013    Past Surgical History  Procedure Laterality Date  . Breast surgery Left 2004    biopsy  . Cesarean section  1996  . Tonsillectomy  1979  . Colonoscopy  2013    Dr. Mechele CollinElliott  . Breast biopsy Left     negative 2005  . Breast cyst aspiration Left     2012    Family History  Problem Relation Age of Onset  . Cancer Father     lung  . Breast cancer Neg Hx     Social History Social History  Substance Use Topics  . Smoking status: Never Smoker   . Smokeless tobacco: None  . Alcohol Use: No    No Known Allergies  Current Outpatient Prescriptions  Medication Sig Dispense Refill  . aspirin 81 MG tablet Take 81 mg by mouth daily.    . DiphenhydrAMINE HCl (BENADRYL ALLERGY PO) Take 1 tablet by mouth as needed.     . triamterene-hydrochlorothiazide (MAXZIDE-25) 37.5-25 MG per tablet TAKE ONE TABLET BY MOUTH DAILY 90 tablet 1   No current facility-administered medications for this visit.    Review of Systems Review of Systems  Constitutional: Negative.   Respiratory: Negative.   Cardiovascular: Negative.     Blood pressure 130/66, pulse 72, resp. rate 14, height 5\' 2"  (1.575 m), last menstrual period 07/12/2011.  Physical Exam Physical Exam  Constitutional: She is oriented to person, place,  and time. She appears well-developed and well-nourished.  Eyes: Conjunctivae are normal. No scleral icterus.  Neck: Neck supple. No thyromegaly present.  Cardiovascular: Normal rate, regular rhythm and normal heart sounds.   Pulmonary/Chest: Effort normal and breath sounds normal. Right breast exhibits no inverted nipple, no mass, no nipple discharge, no skin change and no tenderness. Left breast exhibits no inverted nipple, no mass, no nipple discharge, no skin change and no tenderness.  Abdominal: Soft. Bowel sounds are normal.  Lymphadenopathy:    She has no cervical adenopathy.    She has no axillary adenopathy.  Neurological: She is alert and oriented to person, place, and time.  Skin: Skin is warm and dry.       Data Reviewed Mammogram reviewed and stable  Assessment    Fibrocystic disease Mass chest wall     Plan   Recommended excision of chest wall mass- can be done here in office with local anesthesia.  Follow-up in one year with bilateral mammogram and office visit. Continue with monthly self-breast exams.      This information has been scribed by Milas Kocherebeca Morris, CMA   PCP: Dr. Lanette HampshireSowles   SANKAR,SEEPLAPUTHUR G 12/07/2015, 8:05 AM

## 2015-12-07 ENCOUNTER — Encounter: Payer: Self-pay | Admitting: General Surgery

## 2015-12-08 ENCOUNTER — Ambulatory Visit (INDEPENDENT_AMBULATORY_CARE_PROVIDER_SITE_OTHER): Payer: 59 | Admitting: Family Medicine

## 2015-12-08 ENCOUNTER — Encounter: Payer: Self-pay | Admitting: Family Medicine

## 2015-12-08 VITALS — BP 138/82 | HR 78 | Temp 99.4°F | Resp 16 | Ht 62.0 in | Wt 202.8 lb

## 2015-12-08 DIAGNOSIS — R202 Paresthesia of skin: Secondary | ICD-10-CM | POA: Diagnosis not present

## 2015-12-08 DIAGNOSIS — E8881 Metabolic syndrome: Secondary | ICD-10-CM | POA: Diagnosis not present

## 2015-12-08 DIAGNOSIS — Z1159 Encounter for screening for other viral diseases: Secondary | ICD-10-CM

## 2015-12-08 DIAGNOSIS — E669 Obesity, unspecified: Secondary | ICD-10-CM | POA: Diagnosis not present

## 2015-12-08 DIAGNOSIS — Z23 Encounter for immunization: Secondary | ICD-10-CM

## 2015-12-08 DIAGNOSIS — I10 Essential (primary) hypertension: Secondary | ICD-10-CM | POA: Diagnosis not present

## 2015-12-08 DIAGNOSIS — E785 Hyperlipidemia, unspecified: Secondary | ICD-10-CM

## 2015-12-08 MED ORDER — TRIAMTERENE-HCTZ 37.5-25 MG PO TABS: 1.0000 | ORAL_TABLET | Freq: Every day | ORAL | Status: DC

## 2015-12-08 NOTE — Progress Notes (Signed)
Name: Rachel Davidson   MRN: [id number]    DOB: Aug 18, 1960   Date:12/08/2015       Progress Note  Subjective  Chief Complaint  Chief Complaint  Patient presents with  . Medication Refill    6 month F/U   . Hypertension    Edema in bilateral legs, checks BP at work and gets 140/70's   . Allergic Rhinitis     Bendaryl as needed and controls symptoms as well.     HPI  HTN: she forgets to take her bp medication at times, but she took this am. She denies chest pain, edema or SOB  Metabolic Syndrome: last hgbA1C was 6.0% six months ago, she denies polyphagia, polydipsia or polyuria. She takes baby aspirin a few days weekly  Obesity: she joined a boot camp but only lost 8 lbs and got frustrated about it. She took this Summer off from activity but will go back in the Fall. Discussed dietary modification also.   AR: takes benadryl prn, usually rhinorrhea, post-nasal drainage and nasal congestion but doing well at this time.    Patient Active Problem List   Diagnosis Date Noted  . Essential hypertension 05/10/2015  . Dysmetabolic syndrome 10/28/2014  . Neuropathy (HCC) 10/28/2014  . Adiposity 10/28/2014  . Symptomatic menopausal or female climacteric states 10/28/2014  . Fibrocystic breast 11/04/2012    Past Surgical History  Procedure Laterality Date  . Breast surgery Left 2004    biopsy  . Cesarean section  1996  . Tonsillectomy  1979  . Colonoscopy  2013    Dr. Mechele Collin  . Breast biopsy Left     negative 2005  . Breast cyst aspiration Left     2012    Family History  Problem Relation Age of Onset  . Cancer Father     lung  . Breast cancer Neg Hx     Social History   Social History  . Marital Status: Married    Spouse Name: N/A  . Number of Children: N/A  . Years of Education: N/A   Occupational History  . Not on file.   Social History Main Topics  . Smoking status: Never Smoker   . Smokeless tobacco: Never Used  . Alcohol Use: No  . Drug Use: No  .  Sexual Activity:    Partners: Male   Other Topics Concern  . Not on file   Social History Narrative     Current outpatient prescriptions:  .  aspirin 81 MG tablet, Take 81 mg by mouth daily., Disp: , Rfl:  .  DiphenhydrAMINE HCl (BENADRYL ALLERGY PO), Take 1 tablet by mouth as needed. , Disp: , Rfl:  .  triamterene-hydrochlorothiazide (MAXZIDE-25) 37.5-25 MG tablet, Take 1 tablet by mouth daily., Disp: 90 tablet, Rfl: 1  No Known Allergies   ROS  Constitutional: Negative for fever or significant weight change.  Respiratory: Negative for cough and shortness of breath.   Cardiovascular: Negative for chest pain or palpitations.  Gastrointestinal: Negative for abdominal pain, no bowel changes.  Musculoskeletal: Negative for gait problem or joint swelling.  Skin: Negative for rash.  Neurological: Negative for dizziness or headache.  No other specific complaints in a complete review of systems (except as listed in HPI above). Paresthesia on her feet when wearing shoes at work, but sometimes numbness on right half of right foot  Objective  Filed Vitals:   12/08/15 0945 12/08/15 0958  BP:  138/82  Pulse: 78   Temp: 99.4 F (  37.4 C)   TempSrc: Oral   Resp: 16   Height: 5\' 2"  (1.575 m)   Weight: 202 lb 12.8 oz (91.989 kg)   SpO2: 97%     Body mass index is 37.08 kg/(m^2).  Physical Exam  Constitutional: Patient appears well-developed and well-nourished. Obese  No distress.  HEENT: head atraumatic, normocephalic, pupils equal and reactive to light, neck supple, throat within normal limits Cardiovascular: Normal rate, regular rhythm and normal heart sounds.  No murmur heard. No BLE edema. Pulmonary/Chest: Effort normal and breath sounds normal. No respiratory distress. Abdominal: Soft.  There is no tenderness. Psychiatric: Patient has a normal mood and affect. behavior is normal. Judgment and thought content normal. Neurological: normal exam, has intermittent  paresthesia  PHQ2/9: Depression screen PHQ 2/9 12/08/2015  Decreased Interest 0  Down, Depressed, Hopeless 0  PHQ - 2 Score 0     Fall Risk: Fall Risk  12/08/2015  Falls in the past year? No     Functional Status Survey: Is the patient deaf or have difficulty hearing?: No Does the patient have difficulty seeing, even when wearing glasses/contacts?: No Does the patient have difficulty concentrating, remembering, or making decisions?: No Does the patient have difficulty walking or climbing stairs?: No Does the patient have difficulty dressing or bathing?: No Does the patient have difficulty doing errands alone such as visiting a doctor's office or shopping?: No    Assessment & Plan  1. Essential hypertension  - Comprehensive metabolic panel - triamterene-hydrochlorothiazide (MAXZIDE-25) 37.5-25 MG tablet; Take 1 tablet by mouth daily.  Dispense: 90 tablet; Refill: 1  2. Dysmetabolic syndrome  - Hemoglobin A1c  3. Obesity  Discussed with the patient the risk posed by an increased BMI. Discussed importance of portion control, calorie counting and at least 150 minutes of physical activity weekly. Avoid sweet beverages and drink more water. Eat at least 6 servings of fruit and vegetables daily   4. Dyslipidemia  - Lipid panel  5. Need for Tdap vaccination  - Tdap vaccine greater than or equal to 7yo IM  6. Need for hepatitis C screening test  - Hepatitis C antibody  7. Paresthesia of both feet  Discussed EMG/NCS but she wants to hold off , she denies back problems - Vitamin B12

## 2015-12-09 ENCOUNTER — Other Ambulatory Visit
Admission: RE | Admit: 2015-12-09 | Discharge: 2015-12-09 | Disposition: A | Discharge status: Home / Self Care | Payer: 59 | Source: Ambulatory Visit | Attending: Family Medicine | Admitting: Family Medicine

## 2015-12-09 DIAGNOSIS — Z1159 Encounter for screening for other viral diseases: Secondary | ICD-10-CM | POA: Diagnosis not present

## 2015-12-09 DIAGNOSIS — E785 Hyperlipidemia, unspecified: Secondary | ICD-10-CM | POA: Insufficient documentation

## 2015-12-09 DIAGNOSIS — E8881 Metabolic syndrome: Secondary | ICD-10-CM | POA: Insufficient documentation

## 2015-12-09 LAB — COMPREHENSIVE METABOLIC PANEL
ALBUMIN: 4.3 g/dL (ref 3.5–5.0)
ALK PHOS: 100 U/L (ref 38–126)
ALT: 25 U/L (ref 14–54)
AST: 20 U/L (ref 15–41)
Anion gap: 8 (ref 5–15)
BUN: 30 mg/dL — AB (ref 6–20)
CALCIUM: 9.2 mg/dL (ref 8.9–10.3)
CO2: 28 mmol/L (ref 22–32)
CREATININE: 0.89 mg/dL (ref 0.44–1.00)
Chloride: 101 mmol/L (ref 101–111)
GFR calc Af Amer: >60 mL/min (ref >60)
GFR calc non Af Amer: >60 mL/min (ref >60)
GLUCOSE: 107 mg/dL — AB (ref 65–99)
Potassium: 3.5 mmol/L (ref 3.5–5.1)
SODIUM: 137 mmol/L (ref 135–145)
Total Bilirubin: 1.6 mg/dL — ABNORMAL HIGH (ref 0.3–1.2)
Total Protein: 7.1 g/dL (ref 6.5–8.1)

## 2015-12-09 LAB — LIPID PANEL
CHOLESTEROL: 240 mg/dL — AB (ref 0–200)
HDL: 59 mg/dL (ref >40)
LDL Cholesterol: 161 mg/dL — ABNORMAL HIGH (ref 0–99)
TRIGLYCERIDES: 102 mg/dL (ref <150)
Total CHOL/HDL Ratio: 4.1 RATIO
VLDL: 20 mg/dL (ref 0–40)

## 2015-12-09 LAB — VITAMIN B12: Vitamin B-12: 349 pg/mL (ref 180–914)

## 2015-12-09 LAB — HEMOGLOBIN A1C: Hgb A1c MFr Bld: 6 % (ref 4.0–6.0)

## 2016-01-24 ENCOUNTER — Encounter: Payer: Self-pay | Admitting: Family Medicine

## 2016-01-26 ENCOUNTER — Telehealth: Payer: Self-pay | Admitting: Family Medicine

## 2016-01-26 NOTE — Telephone Encounter (Signed)
Pap was put in patient chart.

## 2016-06-12 ENCOUNTER — Ambulatory Visit (INDEPENDENT_AMBULATORY_CARE_PROVIDER_SITE_OTHER): Payer: 59 | Admitting: Family Medicine

## 2016-06-12 ENCOUNTER — Encounter: Payer: Self-pay | Admitting: Family Medicine

## 2016-06-12 VITALS — BP 136/84 | HR 70 | Temp 98.1°F | Resp 16 | Ht 62.0 in | Wt 211.5 lb

## 2016-06-12 DIAGNOSIS — R7989 Other specified abnormal findings of blood chemistry: Secondary | ICD-10-CM | POA: Diagnosis not present

## 2016-06-12 DIAGNOSIS — E538 Deficiency of other specified B group vitamins: Secondary | ICD-10-CM

## 2016-06-12 DIAGNOSIS — Z1159 Encounter for screening for other viral diseases: Secondary | ICD-10-CM | POA: Diagnosis not present

## 2016-06-12 DIAGNOSIS — G629 Polyneuropathy, unspecified: Secondary | ICD-10-CM | POA: Diagnosis not present

## 2016-06-12 DIAGNOSIS — I10 Essential (primary) hypertension: Secondary | ICD-10-CM | POA: Diagnosis not present

## 2016-06-12 DIAGNOSIS — E785 Hyperlipidemia, unspecified: Secondary | ICD-10-CM

## 2016-06-12 DIAGNOSIS — E8881 Metabolic syndrome: Secondary | ICD-10-CM

## 2016-06-12 MED ORDER — TRIAMTERENE-HCTZ 37.5-25 MG PO TABS: 1.0000 | ORAL_TABLET | Freq: Every day | ORAL | 1 refills | Status: DC

## 2016-06-12 NOTE — Progress Notes (Signed)
Name: Rachel Davidson   MRN: [id number]    DOB: 1961-04-26   Date:06/12/2016       Progress Note  Subjective  Chief Complaint  Chief Complaint  Patient presents with  . Medication Refill    6 month F/U  . 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  She denies chest pain, or SOB. Mild lower extremity edema at the end of her shift  Metabolic Syndrome: last hgbA1C was 6.0% six months ago, she denies polyphagia, polydipsia or polyuria. She takes baby aspirin a few days weekly  Obesity:she has gained weight again through the holidays.   AR: takes benadryl prn, usually rhinorrhea, post-nasal drainage and nasal congestion but doing well at this time.    Patient Active Problem List   Diagnosis Date Noted  . Essential hypertension 05/10/2015  . Dysmetabolic syndrome 10/28/2014  . Neuropathy (HCC) 10/28/2014  . Adiposity 10/28/2014  . Symptomatic menopausal or female climacteric states 10/28/2014  . Fibrocystic breast 11/04/2012    Past Surgical History:  Procedure Laterality Date  . BREAST BIOPSY Left    negative 2005  . BREAST CYST ASPIRATION Left    2012  . BREAST SURGERY Left 2004   biopsy  . CESAREAN SECTION  1996  . COLONOSCOPY  2013   Dr. Mechele CollinElliott  . TONSILLECTOMY  1979    Family History  Problem Relation Age of Onset  . Cancer Father     lung  . Breast cancer Neg Hx     Social History   Social History  . Marital status: Married    Spouse name: N/A  . Number of children: N/A  . Years of education: N/A   Occupational History  . Not on file.   Social History Main Topics  . Smoking status: Never Smoker  . Smokeless tobacco: Never Used  . Alcohol use No  . Drug use: No  . Sexual activity: Yes    Partners: Male   Other Topics Concern  . Not on file   Social History Narrative  . No narrative on file     Current Outpatient Prescriptions:  .  aspirin 81 MG tablet, Take 81  mg by mouth daily., Disp: , Rfl:  .  DiphenhydrAMINE HCl (BENADRYL ALLERGY PO), Take 1 tablet by mouth as needed. , Disp: , Rfl:  .  ibuprofen (ADVIL,MOTRIN) 200 MG tablet, Take by mouth., Disp: , Rfl:  .  triamterene-hydrochlorothiazide (MAXZIDE-25) 37.5-25 MG tablet, Take 1 tablet by mouth daily., Disp: 90 tablet, Rfl: 1  No Known Allergies   ROS  Constitutional: Negative for fever , positive for weight change.  Respiratory: Negative for cough and shortness of breath.   Cardiovascular: Negative for chest pain or palpitations.  Gastrointestinal: Negative for abdominal pain, no bowel changes.  Musculoskeletal: Negative for gait problem or joint swelling.  Skin: Negative for rash.  Neurological: Negative for dizziness or headache.  No other specific complaints in a complete review of systems (except as listed in HPI above).  Objective  Vitals:   06/12/16 0817  BP: 136/84  Pulse: 70  Resp: 16  Temp: 98.1 F (36.7 C)  TempSrc: Oral  SpO2: 96%  Weight: 211 lb 8 oz (95.9 kg)  Height: 5\' 2"  (1.575 m)    Body mass index is 38.68 kg/m.  Physical Exam  Constitutional: Patient appears well-developed and well-nourished. Obese  No distress.  HEENT:  head atraumatic, normocephalic, pupils equal and reactive to light, neck supple, throat within normal limits Cardiovascular: Normal rate, regular rhythm and normal heart sounds.  No murmur heard. No BLE edema. Pulmonary/Chest: Effort normal and breath sounds normal. No respiratory distress. Abdominal: Soft.  There is no tenderness. Psychiatric: Patient has a normal mood and affect. behavior is normal. Judgment and thought content normal.  PHQ2/9: Depression screen [facility] 2/9 06/12/2016 12/08/2015  Decreased Interest 0 0  Down, Depressed, Hopeless 0 0  PHQ - 2 Score 0 0     Fall Risk: Fall Risk  06/12/2016 12/08/2015  Falls in the past year? No No     Functional Status Survey: Is the patient deaf or have difficulty hearing?:  No Does the patient have difficulty seeing, even when wearing glasses/contacts?: No Does the patient have difficulty concentrating, remembering, or making decisions?: No Does the patient have difficulty walking or climbing stairs?: No Does the patient have difficulty dressing or bathing?: No Does the patient have difficulty doing errands alone such as visiting a doctor's office or shopping?: No    Assessment & Plan  1. Essential hypertension  - COMPLETE METABOLIC PANEL WITH GFR  2. Dysmetabolic syndrome  - Hemoglobin A1c - Insulin, fasting  3. Dyslipidemia  - Lipid panel  4. Need for hepatitis C screening test  - Hepatitis C antibody  5. Neuropathy (HCC)  Right lateral foot, stable  6. Low serum vitamin B12  - Vitamin B12

## 2016-06-28 DIAGNOSIS — Z01419 Encounter for gynecological examination (general) (routine) without abnormal findings: Secondary | ICD-10-CM | POA: Diagnosis not present

## 2016-06-28 DIAGNOSIS — Z124 Encounter for screening for malignant neoplasm of cervix: Secondary | ICD-10-CM | POA: Diagnosis not present

## 2016-06-29 DIAGNOSIS — Z124 Encounter for screening for malignant neoplasm of cervix: Secondary | ICD-10-CM | POA: Diagnosis not present

## 2016-06-29 DIAGNOSIS — Z01419 Encounter for gynecological examination (general) (routine) without abnormal findings: Secondary | ICD-10-CM | POA: Diagnosis not present

## 2016-09-07 ENCOUNTER — Other Ambulatory Visit: Payer: Self-pay

## 2016-09-07 DIAGNOSIS — Z1231 Encounter for screening mammogram for malignant neoplasm of breast: Secondary | ICD-10-CM

## 2016-11-22 ENCOUNTER — Ambulatory Visit: Payer: 59 | Admitting: General Surgery

## 2016-11-22 ENCOUNTER — Ambulatory Visit
Admission: RE | Admit: 2016-11-22 | Discharge: 2016-11-22 | Disposition: A | Discharge status: Home / Self Care | Payer: 59 | Source: Ambulatory Visit | Attending: General Surgery | Admitting: General Surgery

## 2016-11-22 DIAGNOSIS — Z1231 Encounter for screening mammogram for malignant neoplasm of breast: Secondary | ICD-10-CM | POA: Insufficient documentation

## 2016-11-27 ENCOUNTER — Telehealth: Payer: Self-pay | Admitting: General Surgery

## 2016-11-27 NOTE — Telephone Encounter (Signed)
LEFT MESSAGE ON [facility]# TO CALL IN & RESCHEDULE APPOINTMENT 12-11-16 TO ANOTHER DAY.(DR Evette CristalSANKAR OUT OF THE [facility])

## 2016-12-11 ENCOUNTER — Ambulatory Visit: Payer: 59 | Admitting: General Surgery

## 2016-12-18 ENCOUNTER — Ambulatory Visit: Payer: 59 | Admitting: General Surgery

## 2016-12-20 ENCOUNTER — Encounter: Payer: Self-pay | Admitting: General Surgery

## 2016-12-20 ENCOUNTER — Ambulatory Visit (INDEPENDENT_AMBULATORY_CARE_PROVIDER_SITE_OTHER): Payer: 59 | Admitting: General Surgery

## 2016-12-20 VITALS — BP 124/82 | HR 64 | Resp 14 | Ht 62.0 in

## 2016-12-20 DIAGNOSIS — Z872 Personal history of diseases of the skin and subcutaneous tissue: Secondary | ICD-10-CM

## 2016-12-20 NOTE — Progress Notes (Signed)
Patient ID: Rachel CarinaMarcella K Cacioppo, female   DOB: Jan 31, 1961, 56 y.o.   MRN: [id number]  Chief Complaint  Patient presents with  . Follow-up    HPI Rachel Davidson is a 56 y.o. female who presents for a breast evaluation. The most recent mammogram was done on 11-22-16.  Patient does perform regular self breast checks and gets regular mammograms done.   No new breast issues.  HPI  Past Medical History:  Diagnosis Date  . Breast screening, unspecified 2012  . Diffuse cystic mastopathy 2012  . Obesity, unspecified   . Screening for obesity 2013  . Special screening for malignant neoplasms, colon 2013    Past Surgical History:  Procedure Laterality Date  . BREAST BIOPSY Left    negative 2005  . BREAST CYST ASPIRATION Left    2012  . BREAST SURGERY Left 2004   biopsy  . CESAREAN SECTION  1996  . COLONOSCOPY  2013   Dr. Mechele CollinElliott  . TONSILLECTOMY  1979    Family History  Problem Relation Age of Onset  . Cancer Father        lung  . Breast cancer Neg Hx     Social History Social History  Substance Use Topics  . Smoking status: Never Smoker  . Smokeless tobacco: Never Used  . Alcohol use No    No Known Allergies  Current Outpatient Prescriptions  Medication Sig Dispense Refill  . aspirin 81 MG tablet Take 81 mg by mouth daily.    . DiphenhydrAMINE HCl (BENADRYL ALLERGY PO) Take 1 tablet by mouth as needed.     Marland Kitchen. ibuprofen (ADVIL,MOTRIN) 200 MG tablet Take by mouth.    . triamterene-hydrochlorothiazide (MAXZIDE-25) 37.5-25 MG tablet Take 1 tablet by mouth daily. 90 tablet 1   No current facility-administered medications for this visit.     Review of Systems Review of Systems  Constitutional: Negative.   Respiratory: Negative.   Cardiovascular: Negative.     Blood pressure 124/82, pulse 64, resp. rate 14, height 5\' 2"  (1.575 m), last menstrual period 07/12/2011.  Physical Exam Physical Exam  Constitutional: She is oriented to person, place, and time. She  appears well-developed and well-nourished.  Eyes: Conjunctivae are normal. No scleral icterus.  Neck: Normal range of motion. Neck supple.  Cardiovascular: Normal rate and regular rhythm.   Pulmonary/Chest: Effort normal and breath sounds normal. Right breast exhibits no inverted nipple, no mass, no nipple discharge, no skin change and no tenderness. Left breast exhibits no inverted nipple, no mass, no nipple discharge, no skin change and no tenderness. Breasts are symmetrical.  Abdominal: Soft. Bowel sounds are normal. There is no tenderness.  Lymphadenopathy:    She has no cervical adenopathy.    She has no axillary adenopathy.  Neurological: She is alert and oriented to person, place, and time.  Skin: Skin is warm and dry.    Data Reviewed Mammogram, prior note  Assessment   fibrocystic breast disease, hx of breast cysts - stable exam, mammogram is benign      Plan    Return to PCP/gyn for mammograms and breast checks     HPI, Physical Exam, Assessment and Plan have been scribed under the direction and in the presence of Kathreen CosierS. G. Jabriel Vanduyne, MD Dorathy DaftMarsha Hatch, RN  I have completed the exam and reviewed the above documentation for accuracy and completeness.  I agree with the above.  [employer] has been used and any errors in dictation or transcription are unintentional.  Yitzchak Kothari  Wynona LunaG. Cheick Suhr, M.D., F.A.C.S.  Gerlene BurdockSANKAR,Jacqulene Huntley G 12/20/2016, 11:01 AM

## 2016-12-20 NOTE — Patient Instructions (Signed)
Return to PCP for mammograms and breast checks

## 2017-02-08 DIAGNOSIS — H5203 Hypermetropia, bilateral: Secondary | ICD-10-CM | POA: Diagnosis not present

## 2018-01-18 ENCOUNTER — Encounter

## 2018-01-21 ENCOUNTER — Encounter: Payer: Self-pay | Admitting: Family Medicine

## 2018-01-21 ENCOUNTER — Ambulatory Visit (INDEPENDENT_AMBULATORY_CARE_PROVIDER_SITE_OTHER): Payer: 59 | Admitting: Family Medicine

## 2018-01-21 VITALS — BP 134/78 | HR 83 | Temp 98.6°F | Resp 14 | Ht 62.0 in | Wt 215.6 lb

## 2018-01-21 DIAGNOSIS — Z1231 Encounter for screening mammogram for malignant neoplasm of breast: Secondary | ICD-10-CM | POA: Diagnosis not present

## 2018-01-21 DIAGNOSIS — G629 Polyneuropathy, unspecified: Secondary | ICD-10-CM

## 2018-01-21 DIAGNOSIS — E8881 Metabolic syndrome: Secondary | ICD-10-CM

## 2018-01-21 DIAGNOSIS — E538 Deficiency of other specified B group vitamins: Secondary | ICD-10-CM | POA: Diagnosis not present

## 2018-01-21 DIAGNOSIS — Z1159 Encounter for screening for other viral diseases: Secondary | ICD-10-CM | POA: Diagnosis not present

## 2018-01-21 DIAGNOSIS — Z23 Encounter for immunization: Secondary | ICD-10-CM | POA: Diagnosis not present

## 2018-01-21 DIAGNOSIS — I10 Essential (primary) hypertension: Secondary | ICD-10-CM | POA: Diagnosis not present

## 2018-01-21 DIAGNOSIS — Z0000 Encounter for general adult medical examination without abnormal findings: Secondary | ICD-10-CM | POA: Diagnosis not present

## 2018-01-21 DIAGNOSIS — M25562 Pain in left knee: Secondary | ICD-10-CM

## 2018-01-21 DIAGNOSIS — E785 Hyperlipidemia, unspecified: Secondary | ICD-10-CM | POA: Diagnosis not present

## 2018-01-21 DIAGNOSIS — Z1239 Encounter for other screening for malignant neoplasm of breast: Secondary | ICD-10-CM

## 2018-01-21 DIAGNOSIS — Z01419 Encounter for gynecological examination (general) (routine) without abnormal findings: Secondary | ICD-10-CM

## 2018-01-21 MED ORDER — TRIAMTERENE-HCTZ 37.5-25 MG PO TABS: 1.0000 | ORAL_TABLET | Freq: Every day | ORAL | 1 refills | Status: DC

## 2018-01-21 NOTE — Progress Notes (Signed)
Name: Rachel Davidson   MRN: [id number]    DOB: 1960/12/06   Date:01/21/2018       Progress Note  Subjective  Chief Complaint  Chief Complaint  Patient presents with  . 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  She denies chest pain, palpitation or SOB. Mild lower extremity edema at the end of her shift.   Metabolic Syndrome: last XNAT5T was 6.0% but has not been seen in over one year and did not have labs done since 2017. She polyphagia, polydipsia or polyuria.   Obesity:she has gained a little weight since last visit, she states she does not like sweets but likes food, chips and bread. She wants to make the decision of changing life style on her own.   AR: takes claritin prn and is doing well at this time, no nasal congestion or rhinorrhea.   Dyslipidemia: we will recheck labs.   Left knee intermittent pain: she states left knee feels tight, sometimes achy and occasionally pops and causes a more intense  pain, taking ibuprofen more often. It started about one month ago. Sometimes catches   Diet: high dairy, also fruit and vegetables, but also likes eating out and bread. Needs to work on portion control  Exercise: walking but not enough   USPSTF grade A and B recommendations   Depression:  Depression screen [facility] 2/9 01/21/2018 06/12/2016 12/08/2015  Decreased Interest 0 0 0  Down, Depressed, Hopeless 0 0 0  PHQ - 2 Score 0 0 0   Hypertension: BP Readings from Last 3 Encounters:  01/21/18 134/78  12/20/16 124/82  06/12/16 136/84   Obesity: Wt Readings from Last 3 Encounters:  01/21/18 215 lb 9.6 oz (97.8 kg)  06/12/16 211 lb 8 oz (95.9 kg)  12/08/15 202 lb 12.8 oz (92 kg)   BMI Readings from Last 3 Encounters:  01/21/18 39.43 kg/m  12/20/16 38.68 kg/m  06/12/16 38.68 kg/m    Hep C Screening: today  STD testing and prevention (HIV/chl/gon/syphilis): N/A Intimate  partner violence: negative  Sexual History/Pain during Intercourse: Menstrual History/LMP/Abnormal Bleeding: Incontinence Symptoms:   Advanced Care Planning: A voluntary discussion about advance care planning including the explanation and discussion of advance directives.  Discussed health care proxy and Living will, and the patient was able to identify a health care proxy as Mckinsley Koelzer .  Patient does not have a living will at present time. If patient does have living will, I have requested they bring this to the clinic to be scanned in to their chart.  Breast cancer: due for mammogram  BRCA gene screening: N/A Cervical cancer screening: sees GYN   Osteoporosis Screening:  Discussed bone density and she wants to hold off for now.   Lipids:  Lab Results  Component Value Date   CHOL 240 (H) 12/09/2015   CHOL 207 (H) 05/04/2015   CHOL 223 (H) 03/25/2014   Lab Results  Component Value Date   HDL 59 12/09/2015   HDL 49 05/04/2015   HDL 47 03/25/2014   Lab Results  Component Value Date   LDLCALC 161 (H) 12/09/2015   LDLCALC 137 (H) 05/04/2015   Lab Results  Component Value Date   TRIG 102 12/09/2015   TRIG 106 05/04/2015   Lab Results  Component Value Date   CHOLHDL 4.1 12/09/2015   CHOLHDL 4.2 05/04/2015  No results found for: LDLDIRECT  Glucose:  Glucose  Date Value Ref Range Status  03/17/2013 126 (H) 65 - 99 mg/dL Final   Glucose, Bld  Date Value Ref Range Status  12/09/2015 107 (H) 65 - 99 mg/dL Final  05/04/2015 119 (H) 65 - 99 mg/dL Final  10/30/2014 111 (H) 65 - 99 mg/dL Final    Skin cancer:  Discussed atypical lesions  Colorectal cancer: up to date  Lung cancer:  Low Dose CT Chest recommended if Age 3-80 years, 30 pack-year currently smoking OR have quit w/in 15years. Patient does not qualify.   ECG: today   Patient Active Problem List   Diagnosis Date Noted  . B12 deficiency 01/21/2018  . Essential hypertension 05/10/2015  . Dysmetabolic  syndrome 60/60/0459  . Neuropathy 10/28/2014  . Obesity (BMI 30-39.9) 10/28/2014  . Symptomatic menopausal or female climacteric states 10/28/2014  . Fibrocystic breast 11/04/2012    Past Surgical History:  Procedure Laterality Date  . BREAST BIOPSY Left    negative 2005  . BREAST CYST ASPIRATION Left    2012  . BREAST SURGERY Left 2004   biopsy  . CESAREAN SECTION  1996  . CESAREAN SECTION  1999  . COLONOSCOPY  2013   Dr. Vira Agar  . TONSILLECTOMY  1979    Family History  Problem Relation Age of Onset  . Cancer Father        lung  . Breast cancer Neg Hx     Social History   Socioeconomic History  . Marital status: Married    Spouse name: Not on file  . Number of children: 2  . Years of education: Not on file  . Highest education level: Associate degree: academic program  Occupational History  . Not on file  Social Needs  . Financial resource strain: Not hard at all  . Food insecurity:    Worry: Never true    Inability: Never true  . Transportation needs:    Medical: No    Non-medical: No  Tobacco Use  . Smoking status: Never Smoker  . Smokeless tobacco: Never Used  Substance and Sexual Activity  . Alcohol use: No    Alcohol/week: 0.0 standard drinks  . Drug use: No  . Sexual activity: Yes    Partners: Male  Lifestyle  . Physical activity:    Days per week: 3 days    Minutes per session: 30 min  . Stress: Not at all  Relationships  . Social connections:    Talks on phone: More than three times a week    Gets together: More than three times a week    Attends religious service: More than 4 times per year    Active member of club or organization: No    Attends meetings of clubs or organizations: Never    Relationship status: Married  . Intimate partner violence:    Fear of current or ex partner: No    Emotionally abused: No    Physically abused: No    Forced sexual activity: No  Other Topics Concern  . 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  ibuprofen (ADVIL,MOTRIN) 200 MG tablet, Take by mouth., Disp: , Rfl:  .  loratadine (CLARITIN) 10 MG tablet, Take 10 mg by mouth daily., Disp: , Rfl:  .  DiphenhydrAMINE HCl (BENADRYL ALLERGY PO), Take  1 tablet by mouth as needed. , Disp: , Rfl:  .  triamterene-hydrochlorothiazide (MAXZIDE-25) 37.5-25 MG tablet, Take 1 tablet by mouth daily., Disp: 90 tablet, Rfl: 1  No Known Allergies   ROS  Constitutional: Negative for fever or significant  weight change.  Respiratory: Negative for cough and shortness of breath.   Cardiovascular: Negative for chest pain or palpitations.  Gastrointestinal: Negative for abdominal pain, no bowel changes.  Musculoskeletal: positive  for gait problem or joint swelling.  Skin: Negative for rash.  Neurological: Negative for dizziness or headache.  No other specific complaints in a complete review of systems (except as listed in HPI above).  Objective  Vitals:   01/21/18 1404  BP: 134/78  Pulse: 83  Resp: 14  Temp: 98.6 F (37 C)  TempSrc: Oral  SpO2: 99%  Weight: 215 lb 9.6 oz (97.8 kg)  Height: 5' 2"  (1.575 m)    Body mass index is 39.43 kg/m.  Physical Exam  Constitutional: Patient appears well-developed and obese . No distress.  HENT: Head: Normocephalic and atraumatic. Ears: B TMs ok, no erythema or effusion; Nose: Nose normal. Mouth/Throat: Oropharynx is clear and moist. No oropharyngeal exudate.  Eyes: Conjunctivae and EOM are normal. Pupils are equal, round, and reactive to light. No scleral icterus.  Neck: Normal range of motion. Neck supple. No JVD present. No thyromegaly present.  Cardiovascular: Normal rate, regular rhythm and normal heart sounds.  No murmur heard. No BLE edema. Pulmonary/Chest: Effort normal and breath sounds normal. No respiratory distress. Abdominal: Soft. Bowel sounds are normal, no distension. There  is no tenderness. no masses Breast: not done  FEMALE GENITALIA:  Not done RECTAL: not done Musculoskeletal: Normal range of motion, no joint effusions.Pain on left lateral aspect of anterior knee, no crepitus , positive mcMurray sign, discussed possible meniscal tear and she will try losing weight and follow up with Ortho if no improvement  Neurological: he is alert and oriented to person, place, and time. No cranial nerve deficit. Coordination, balance, strength, speech and gait are normal.  Skin: Skin is warm and dry. No rash noted. No erythema.  Psychiatric: Patient has a normal mood and affect. behavior is normal. Judgment and thought content normal.  PHQ2/9: Depression screen [facility] 2/9 01/21/2018 06/12/2016 12/08/2015  Decreased Interest 0 0 0  Down, Depressed, Hopeless 0 0 0  PHQ - 2 Score 0 0 0    Fall Risk: Fall Risk  01/21/2018 06/12/2016 12/08/2015  Falls in the past year? No No No    Functional Status Survey: Is the patient deaf or have difficulty hearing?: No Does the patient have difficulty seeing, even when wearing glasses/contacts?: Yes Does the patient have difficulty concentrating, remembering, or making decisions?: No Does the patient have difficulty walking or climbing stairs?: No Does the patient have difficulty dressing or bathing?: No Does the patient have difficulty doing errands alone such as visiting a doctor's office or shopping?: No   Assessment & Plan  1. Well woman exam  She will follow up with gyn for pap and breast exam   2. Dysmetabolic syndrome  - Hemoglobin A1c  3. Dyslipidemia  - Lipid panel  4. Need for hepatitis C screening test  - Hepatitis C Antibody  5. Neuropathy  - CBC with Differential/Platelet - COMPLETE METABOLIC PANEL WITH GFR  6. Low serum vitamin B12  - Vitamin B12  7. Essential hypertension  - CBC with Differential/Platelet - COMPLETE METABOLIC PANEL WITH GFR - triamterene-hydrochlorothiazide (MAXZIDE-25) 37.5-25  MG  tablet; Take 1 tablet by mouth daily.  Dispense: 90 tablet; Refill: 1 -EKG   8. Breast cancer screening  - MM DIGITAL SCREENING BILATERAL; Future   10. Left anterior knee pain  Discussed possible meniscal tear    -USPSTF grade A and B recommendations reviewed with patient; age-appropriate recommendations, preventive care, screening tests, etc discussed and encouraged; healthy living encouraged; see AVS for patient education given to patient -Discussed importance of 150 minutes of physical activity weekly, eat two servings of fish weekly, eat one serving of tree nuts ( cashews, pistachios, pecans, almonds.Marland Kitchen) every other day, eat 6 servings of fruit/vegetables daily and drink plenty of water and avoid sweet beverages.

## 2018-01-22 LAB — LIPID PANEL
Cholesterol: 199 mg/dL (ref <200)
HDL: 49 mg/dL — ABNORMAL LOW (ref >50)
LDL Cholesterol (Calc): 109 mg/dL (calc) — ABNORMAL HIGH
NON-HDL CHOLESTEROL (CALC): 150 mg/dL — AB (ref <130)
Total CHOL/HDL Ratio: 4.1 (calc) (ref <5.0)
Triglycerides: 286 mg/dL — ABNORMAL HIGH (ref <150)

## 2018-01-22 LAB — CBC WITH DIFFERENTIAL/PLATELET
BASOS PCT: 0.8 %
Basophils Absolute: 74 cells/uL (ref 0–200)
EOS ABS: 129 {cells}/uL (ref 15–500)
EOS PCT: 1.4 %
HCT: 43.6 % (ref 35.0–45.0)
HEMOGLOBIN: 14.7 g/dL (ref 11.7–15.5)
Lymphs Abs: 2282 cells/uL (ref 850–3900)
MCH: 28.7 pg (ref 27.0–33.0)
MCHC: 33.7 g/dL (ref 32.0–36.0)
MCV: 85 fL (ref 80.0–100.0)
MONOS PCT: 8.8 %
MPV: 12.2 fL (ref 7.5–12.5)
NEUTROS ABS: 5906 {cells}/uL (ref 1500–7800)
Neutrophils Relative %: 64.2 %
PLATELETS: 237 10*3/uL (ref 140–400)
RBC: 5.13 10*6/uL — AB (ref 3.80–5.10)
RDW: 13.4 % (ref 11.0–15.0)
TOTAL LYMPHOCYTE: 24.8 %
WBC mixed population: 810 cells/uL (ref 200–950)
WBC: 9.2 10*3/uL (ref 3.8–10.8)

## 2018-01-22 LAB — HEMOGLOBIN A1C
EAG (MMOL/L): 7.3 (calc)
HEMOGLOBIN A1C: 6.2 %{Hb} — AB (ref <5.7)
MEAN PLASMA GLUCOSE: 131 (calc)

## 2018-01-22 LAB — COMPLETE METABOLIC PANEL WITH GFR
AG RATIO: 2.2 (calc) (ref 1.0–2.5)
ALKALINE PHOSPHATASE (APISO): 97 U/L (ref 33–130)
ALT: 23 U/L (ref 6–29)
AST: 18 U/L (ref 10–35)
Albumin: 4.3 g/dL (ref 3.6–5.1)
BILIRUBIN TOTAL: 1 mg/dL (ref 0.2–1.2)
BUN: 19 mg/dL (ref 7–25)
CALCIUM: 9.3 mg/dL (ref 8.6–10.4)
CHLORIDE: 103 mmol/L (ref 98–110)
CO2: 31 mmol/L (ref 20–32)
Creat: 0.9 mg/dL (ref 0.50–1.05)
GFR, EST NON AFRICAN AMERICAN: 71 mL/min/{1.73_m2} (ref >=60)
GFR, Est African American: 82 mL/min/{1.73_m2} (ref >=60)
Globulin: 2 g/dL (calc) (ref 1.9–3.7)
Glucose, Bld: 107 mg/dL (ref 65–139)
Potassium: 3.6 mmol/L (ref 3.5–5.3)
Sodium: 142 mmol/L (ref 135–146)
Total Protein: 6.3 g/dL (ref 6.1–8.1)

## 2018-01-22 LAB — VITAMIN B12: VITAMIN B 12: 532 pg/mL (ref 200–1100)

## 2018-01-22 LAB — HEPATITIS C ANTIBODY
HEP C AB: NONREACTIVE
SIGNAL TO CUT-OFF: 0.01 (ref <1.00)

## 2018-03-05 ENCOUNTER — Ambulatory Visit (INDEPENDENT_AMBULATORY_CARE_PROVIDER_SITE_OTHER): Payer: 59 | Admitting: Certified Nurse Midwife

## 2018-03-05 ENCOUNTER — Encounter: Payer: Self-pay | Admitting: Certified Nurse Midwife

## 2018-03-05 VITALS — BP 126/80 | HR 64 | Ht 62.0 in

## 2018-03-05 DIAGNOSIS — Z01411 Encounter for gynecological examination (general) (routine) with abnormal findings: Secondary | ICD-10-CM | POA: Diagnosis not present

## 2018-03-05 DIAGNOSIS — R232 Flushing: Secondary | ICD-10-CM | POA: Diagnosis not present

## 2018-03-05 DIAGNOSIS — N951 Menopausal and female climacteric states: Secondary | ICD-10-CM

## 2018-03-05 DIAGNOSIS — Z1239 Encounter for other screening for malignant neoplasm of breast: Secondary | ICD-10-CM | POA: Diagnosis not present

## 2018-03-05 DIAGNOSIS — Z01419 Encounter for gynecological examination (general) (routine) without abnormal findings: Secondary | ICD-10-CM

## 2018-03-05 NOTE — Progress Notes (Signed)
Gynecology Annual Exam  PCP: Alba Cory, MD  Chief Complaint:  Chief Complaint  Patient presents with  . Gynecologic Exam    History of Present Illness:Rachel Davidson presents today for her annual exam. She is a 57 year old Caucasian/White female , G 4 P 2 0 2 2. She is having no significant GYN problems.  Her menses are absent and she is postmenopausal. Her LMP was around 11/2013.  She has had no spotting. She does have occasional hot flashes  The patient's past medical history is notable for obesity and hypertension (takes Maxide). Her PCP is Dr Carlynn Purl.  Since her last annual GYN exam dated 06/28/2016, she has had no significant changes in her health history.  She is sexually active. She does not have vaginal dryness.   Her most recent pap smear was obtained 06/28/2016 and was NIL. There is no history of abnormal Pap smears Her most recent mammogram obtained 11/22/2016 and was normal. She has a hx of fibrocystic changes and Dr Evette Cristal followed her for her mammograms and exams prior to his retirement. There is no family history of breast cancer. There is no family history of ovarian cancer. The patient does do monthly self breast exams.  She had a colonoscopy in 2012 that was normal. Her next colonoscopy is due in 10 years.  A DEXA scan is not applicable for this patient.  The patient does not smoke.  The patient does not drink alcohol.  The patient does not use illegal drugs.  The patient is exercising by walking. The patient may get adequate calcium in her diet.  She had a recent cholesterol screen in 2019 by Dr Carlynn Purl that was remarkable for elevated triglycerides. Her hemoglobin A1C was 6.2%.    Review of Systems: Review of Systems  Constitutional: Negative for chills, fever and weight loss.  HENT: Negative for congestion, sinus pain and sore throat.   Eyes: Negative for blurred vision and pain.  Respiratory: Negative for hemoptysis, shortness of breath and  wheezing.   Cardiovascular: Negative for chest pain, palpitations and leg swelling.  Gastrointestinal: Negative for abdominal pain, blood in stool, diarrhea, heartburn, nausea and vomiting.  Genitourinary: Negative for dysuria, frequency, hematuria and urgency.  Musculoskeletal: Positive for joint pain (left knee). Negative for back pain and myalgias.  Skin: Negative for itching and rash.  Neurological: Negative for dizziness, tingling and headaches.  Endo/Heme/Allergies: Negative for environmental allergies and polydipsia. Does not bruise/bleed easily.       Positive for hot flashes   Psychiatric/Behavioral: Negative for depression. The patient is not nervous/anxious and does not have insomnia.     Past Medical History:  Past Medical History:  Diagnosis Date  . Diffuse cystic mastopathy 2012  . Hypertension   . Obesity, unspecified   . Special screening for malignant neoplasms, colon 2013    Past Surgical History:  Past Surgical History:  Procedure Laterality Date  . BREAST CYST ASPIRATION Left 1990s and 2012   Dr Evette Cristal  . BREAST SURGERY Left 04/2002   biopsy-benign  . CESAREAN SECTION  1996  . CESAREAN SECTION and Bilateral Tubal Ligation  1999  . COLONOSCOPY  2013   Dr. Mechele Collin  . DILATION AND CURETTAGE OF UTERUS  1995/ 1985   blighted ovum-CAK and incomplete AB-Dr CAK  . ENDOMETRIAL BIOPSY  03/2009   proliferative  . TONSILLECTOMY  1979    Family History:  Family History  Problem Relation Age of Onset  . Cancer Father  lung  . Diabetes Father   . Heart disease Father   . Hypertension Father   . Hypertension Sister   . Hypertension Brother   . Prostate cancer Maternal Grandfather   . Breast cancer Neg Hx     Social History:  Social History   Socioeconomic History  . Marital status: Married    Spouse name: Not on file  . Number of children: 2  . Years of education: Not on file  . Highest education level: Associate degree: academic program    Occupational History  . Occupation: RN-surgical floor at [employer]  Social Needs  . Financial resource strain: Not hard at all  . Food insecurity:    Worry: Never true    Inability: Never true  . Transportation needs:    Medical: No    Non-medical: No  Tobacco Use  . Smoking status: Never Smoker  . Smokeless tobacco: Never Used  Substance and Sexual Activity  . Alcohol use: No    Alcohol/week: 0.0 standard drinks  . Drug use: No  . Sexual activity: Yes    Partners: Male    Birth control/protection: Post-menopausal  Lifestyle  . Physical activity:    Days per week: 3 days    Minutes per session: 30 min  . Stress: Not at all  Relationships  . Social connections:    Talks on phone: More than three times a week    Gets together: More than three times a week    Attends religious service: More than 4 times per year    Active member of club or organization: No    Attends meetings of clubs or organizations: Never    Relationship status: Married  . Intimate partner violence:    Fear of current or ex partner: No    Emotionally abused: No    Physically abused: No    Forced sexual activity: No  Other Topics Concern  . 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 Authorizing Provider  DiphenhydrAMINE HCl (BENADRYL ALLERGY PO) Take 1 tablet by mouth as needed.     [provider]  ibuprofen (ADVIL,MOTRIN) 200 MG tablet Take by mouth.    [provider]  loratadine (CLARITIN) 10 MG tablet Take 10 mg by mouth daily.    [provider]  triamterene-hydrochlorothiazide (MAXZIDE-25) 37.5-25 MG tablet Take 1 tablet by mouth daily. 01/21/18   Alba Cory, MD    Physical Exam Vitals:BP 126/80   Pulse 64   Ht 5\' 2"  (1.575 m)   LMP 07/12/2011 Comment: Pt states she only  weighs at PCP.  BMI 39.43 kg/m   General: WF in NAD HEENT: normocephalic, anicteric Neck: no thyroid enlargement, no palpable nodules, no cervical lymphadenopathy  Pulmonary: No increased work of breathing, CTAB Cardiovascular: RRR, without murmur  Breast: Breast symmetrical, no tenderness, no palpable nodules or masses, no skin or nipple retraction present, no nipple discharge.  No axillary, infraclavicular or supraclavicular lymphadenopathy. Abdomen: Soft, obese,non-tender, non-distended.  Umbilicus without lesions.  No hepatomegaly or masses palpable. No evidence of hernia. Genitourinary:  External: Normal external female genitalia.  Normal urethral meatus, normal Bartholin's and Skene's glands.    Vagina: Normal vaginal mucosa, no evidence of prolapse.    Cervix:  no bleeding, non-tender  Uterus: Anteverted, small,  mobile, and non-tender  Adnexa: No adnexal masses, non-tender  Rectal: deferred  Lymphatic: no evidence of inguinal lymphadenopathy Extremities: no edema, erythema, or tenderness Neurologic: Grossly intact Psychiatric: mood appropriate, affect full     Assessment: 57 y.o. [id number] normal postmenopausal gyn exam Vasomotor symptoms  Plan:     1) Breast cancer screening - recommend monthly self breast exam and annual screening mammograms Mammogram was ordered today. Patient to schedule [facility]  2) Colon cancer screening: colonoscopy 2012-next due 2022.  3) Cervical cancer screening - Pap not done. ASCCP guidelines and rational discussed.  Patient opts for every 2-3 years screening interval  4) Routine healthcare maintenance including cholesterol and diabetes screening managed by PCP  Discussed OTC treatments for vasomotor symptoms  5) RTO 1 year and prn  Farrel Conners, CNM

## 2018-03-25 ENCOUNTER — Ambulatory Visit
Admission: RE | Admit: 2018-03-25 | Discharge: 2018-03-25 | Disposition: A | Discharge status: Home / Self Care | Payer: 59 | Source: Ambulatory Visit | Attending: Certified Nurse Midwife | Admitting: Certified Nurse Midwife

## 2018-03-25 DIAGNOSIS — Z1231 Encounter for screening mammogram for malignant neoplasm of breast: Secondary | ICD-10-CM | POA: Diagnosis not present

## 2018-03-25 DIAGNOSIS — Z1239 Encounter for other screening for malignant neoplasm of breast: Secondary | ICD-10-CM | POA: Insufficient documentation

## 2018-05-13 DIAGNOSIS — H5203 Hypermetropia, bilateral: Secondary | ICD-10-CM | POA: Diagnosis not present

## 2018-09-02 ENCOUNTER — Ambulatory Visit (INDEPENDENT_AMBULATORY_CARE_PROVIDER_SITE_OTHER): Payer: No Typology Code available for payment source | Admitting: Family Medicine

## 2018-09-02 ENCOUNTER — Encounter: Payer: Self-pay | Admitting: Family Medicine

## 2018-09-02 ENCOUNTER — Other Ambulatory Visit: Payer: Self-pay

## 2018-09-02 VITALS — BP 140/90 | HR 75 | Temp 98.0°F | Resp 16 | Ht 62.0 in | Wt 208.0 lb

## 2018-09-02 DIAGNOSIS — E8881 Metabolic syndrome: Secondary | ICD-10-CM | POA: Diagnosis not present

## 2018-09-02 DIAGNOSIS — I10 Essential (primary) hypertension: Secondary | ICD-10-CM

## 2018-09-02 DIAGNOSIS — E538 Deficiency of other specified B group vitamins: Secondary | ICD-10-CM

## 2018-09-02 DIAGNOSIS — E785 Hyperlipidemia, unspecified: Secondary | ICD-10-CM | POA: Diagnosis not present

## 2018-09-02 MED ORDER — TRIAMTERENE-HCTZ 37.5-25 MG PO TABS: 1.0000 | ORAL_TABLET | Freq: Every day | ORAL | 1 refills | Status: DC

## 2018-09-02 MED ORDER — VITAMIN B-12 1000 MCG SL SUBL: 1.0000 | SUBLINGUAL_TABLET | Freq: Every day | SUBLINGUAL | 1 refills | Status: DC

## 2018-09-02 NOTE — Progress Notes (Signed)
Name: Rachel Davidson   MRN: [id number]    DOB: 1961-04-27   Date:09/02/2018       Progress Note  Subjective  Chief Complaint  Chief Complaint  Patient presents with  . 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 Mild lower extremity edema at the end of her shift.   Metabolic Syndrome: last hgbA1C was 6.2%. She polyphagia, polydipsia or polyuria, discussed a diabetic diet.    B12 deficiency: states very seldom has burning on her feet, taking otc supplementation   Obesity: she has lost some weight since last visit.  She states since COVID-19 she has a better routine, not eating right before bed. Discussed cutting down on eliminating sweet tea   AR: she has been taking benadryl now and symptoms at this time, she denies rhinorrhea, nasal congestion or sneezing   Dyslipidemia: no need for statins at this time, but discussed a healthy life style   The 10-year ASCVD risk score Denman George DC Jr., et al., 2013) is: 4.7%   Values used to calculate the score:     Age: 58 years     Sex: Female     Is Non-Hispanic African American: No     Diabetic: No     Tobacco smoker: No     Systolic Blood Pressure: 140 mmHg     Is BP treated: Yes     HDL Cholesterol: 49 mg/dL     Total Cholesterol: 199 mg/dL    Left knee intermittent pain: she states left knee feels tight worse when sitting or standing for a long time, okay if she is moving around, sometimes achy and occasionally pops and causes a more intense  pain, taking ibuprofen very seldom    Patient Active Problem List   Diagnosis Date Noted  . B12 deficiency 01/21/2018  . Essential hypertension 05/10/2015  . Dysmetabolic syndrome 10/28/2014  . Neuropathy 10/28/2014  . Obesity (BMI 30-39.9) 10/28/2014  . Symptomatic menopausal or female climacteric states 10/28/2014  . Fibrocystic breast 11/04/2012    Past Surgical History:  Procedure  Laterality Date  . BREAST CYST ASPIRATION Left 1990s and 2012   Dr Evette Cristal  . BREAST SURGERY Left 04/2002   biopsy-benign  . CESAREAN SECTION  1996  . CESAREAN SECTION and Bilateral Tubal Ligation  1999  . COLONOSCOPY  2013   Dr. Mechele Collin  . DILATION AND CURETTAGE OF UTERUS  1995/ 1985   blighted ovum-CAK and incomplete AB-Dr CAK  . ENDOMETRIAL BIOPSY  03/2009   proliferative  . TONSILLECTOMY  1979    Family History  Problem Relation Age of Onset  . Cancer Father        lung  . Diabetes Father   . Heart disease Father   . Hypertension Father   . Hypertension Sister   . Hypertension Brother   . Prostate cancer Maternal Grandfather   . Breast cancer Neg Hx     Social History   Socioeconomic History  . Marital status: Married    Spouse name: Not on file  . Number of children: 2  . Years of education: Not on file  . Highest education level: Associate degree: academic program  Occupational History  . Occupation: RN-surgical floor at [employer]  Social Needs  . Financial resource strain: Not hard at all  . Food insecurity:    Worry: Never true    Inability: Never true  . Transportation  needs:    Medical: No    Non-medical: No  Tobacco Use  . Smoking status: Never Smoker  . Smokeless tobacco: Never Used  Substance and Sexual Activity  . Alcohol use: No    Alcohol/week: 0.0 standard drinks  . Drug use: No  . Sexual activity: Yes    Partners: Male    Birth control/protection: Post-menopausal  Lifestyle  . Physical activity:    Days per week: 3 days    Minutes per session: 30 min  . Stress: Not at all  Relationships  . Social connections:    Talks on phone: More than three times a week    Gets together: More than three times a week    Attends religious service: More than 4 times per year    Active member of club or organization: No    Attends meetings of clubs or organizations: Never    Relationship status: Married  . Intimate partner violence:    Fear of current  or ex partner: No    Emotionally abused: No    Physically abused: No    Forced sexual activity: No  Other Topics Concern  . 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  DiphenhydrAMINE HCl (BENADRYL ALLERGY PO), Take 1 tablet by mouth as needed. , Disp: , Rfl:  .  ibuprofen (ADVIL,MOTRIN) 200 MG tablet, Take by mouth., Disp: , Rfl:  .  loratadine (CLARITIN) 10 MG tablet, Take 10 mg by mouth daily., Disp: , Rfl:  .  triamterene-hydrochlorothiazide (MAXZIDE-25) 37.5-25 MG tablet, Take 1 tablet by mouth daily., Disp: 90 tablet, Rfl: 1  No Known Allergies  I personally reviewed active problem list, medication list, allergies, family history, social history with the patient/caregiver today.   ROS  Constitutional: Negative for fever or weight change.  Respiratory: Negative for cough and shortness of breath.   Cardiovascular: Negative for chest pain or palpitations.  Gastrointestinal: Negative for abdominal pain, no bowel changes.  Musculoskeletal: Negative for gait problem or joint swelling.  Skin: Negative for rash.  Neurological: Negative for dizziness or headache.  No other specific complaints in a complete review of systems (except as listed in HPI above).  Objective  Vitals:   09/02/18 0919  BP: 140/90  Pulse: 75  Resp: 16  Temp: 98 F (36.7 C)  TempSrc: Oral  SpO2: 97%  Weight: 208 lb (94.3 kg)  Height: 5\' 2"  (1.575 m)    Body mass index is 38.04 kg/m.  Physical Exam  Constitutional: Patient appears well-developed and well-nourished. Obese  No distress.  HEENT: head atraumatic, normocephalic, pupils equal and reactive to light, neck supple, throat within normal limits Cardiovascular: Normal rate, regular rhythm and normal heart sounds.  No murmur heard. No BLE edema. Pulmonary/Chest: Effort normal and breath sounds normal. No respiratory distress. Abdominal:  Soft.  There is no tenderness. Psychiatric: Patient has a normal mood and affect. behavior is normal. Judgment and thought content normal.   PHQ2/9: Depression screen [facility] 2/9 09/02/2018 01/21/2018 06/12/2016 12/08/2015  Decreased Interest 0 0 0 0  Down, Depressed, Hopeless 0 0 0 0  PHQ - 2 Score 0 0 0 0  Altered sleeping 0 - - -  Tired, decreased energy 0 - - -  Change in appetite 0 - - -  Feeling bad or failure about yourself  0 - - -  Trouble  concentrating 0 - - -  Moving slowly or fidgety/restless 0 - - -  Suicidal thoughts 0 - - -  PHQ-9 Score 0 - - -    phq 9 is positive and negative   Fall Risk: Fall Risk  09/02/2018 01/21/2018 06/12/2016 12/08/2015  Falls in the past year? 0 No No No  Number falls in past yr: 0 - - -  Injury with Fall? 0 - - -    Functional Status Survey: Is the patient deaf or have difficulty hearing?: No Does the patient have difficulty seeing, even when wearing glasses/contacts?: No Does the patient have difficulty concentrating, remembering, or making decisions?: No Does the patient have difficulty walking or climbing stairs?: No Does the patient have difficulty dressing or bathing?: No Does the patient have difficulty doing errands alone such as visiting a doctor's office or shopping?: No   Assessment & Plan  1. Essential hypertension  - triamterene-hydrochlorothiazide (MAXZIDE-25) 37.5-25 MG tablet; Take 1 tablet by mouth daily.  Dispense: 90 tablet; Refill: 1  2. B12 deficiency  - Cyanocobalamin (VITAMIN B-12) 1000 MCG SUBL; Place 1 tablet (1,000 mcg total) under the tongue daily.  Dispense: 100 tablet; Refill: 1  3. Dysmetabolic syndrome  Discussed life style modification   4. Dyslipidemia  Discussed life style modification

## 2019-01-01 ENCOUNTER — Ambulatory Visit (INDEPENDENT_AMBULATORY_CARE_PROVIDER_SITE_OTHER): Payer: No Typology Code available for payment source | Admitting: Family Medicine

## 2019-01-01 ENCOUNTER — Encounter: Payer: Self-pay | Admitting: Family Medicine

## 2019-01-01 VITALS — BP 124/76 | HR 67 | Temp 96.9°F | Resp 16 | Ht 62.0 in | Wt 210.8 lb

## 2019-01-01 DIAGNOSIS — Z01419 Encounter for gynecological examination (general) (routine) without abnormal findings: Secondary | ICD-10-CM

## 2019-01-01 DIAGNOSIS — E8881 Metabolic syndrome: Secondary | ICD-10-CM

## 2019-01-01 DIAGNOSIS — I10 Essential (primary) hypertension: Secondary | ICD-10-CM

## 2019-01-01 DIAGNOSIS — E785 Hyperlipidemia, unspecified: Secondary | ICD-10-CM | POA: Diagnosis not present

## 2019-01-01 NOTE — Patient Instructions (Signed)

## 2019-01-01 NOTE — Progress Notes (Signed)
Name: Rachel Davidson   MRN: [id number]    DOB: 03/20/1961   Date:01/01/2019       Progress Note  Subjective  Chief Complaint  Chief Complaint  Patient presents with  . Well woman exam    HPI   Patient presents for annual CPE   Diet: snacking less, lost 5 lbs since last visit Exercise: not currently, but she will start walking three days a week for 30 minutes   USPSTF grade A and B recommendations    Office Visit from 01/01/2019 in [facility]  AUDIT-C Score  0      Hypertension: BP Readings from Last 3 Encounters:  01/01/19 124/76  09/02/18 140/90  03/05/18 126/80   Obesity: Wt Readings from Last 3 Encounters:  01/01/19 210 lb 12.8 oz (95.6 kg)  09/02/18 208 lb (94.3 kg)  01/21/18 215 lb 9.6 oz (97.8 kg)   BMI Readings from Last 3 Encounters:  01/01/19 38.56 kg/m  09/02/18 38.04 kg/m  03/05/18 39.43 kg/m    Hep C Screening: up to date STD testing and prevention (HIV/chl/gon/syphilis): N/A Intimate partner violence:negative  Sexual History/Pain during Intercourse: no pain during intercourse, discussed post-menopausal bleeding  Menstrual History/LMP/Abnormal Bleeding: s/p menopause  Incontinence Symptoms: no problems   Advanced Care Planning: A voluntary discussion about advance care planning including the explanation and discussion of advance directives.  Discussed health care proxy and Living will, and the patient was able to identify a health care proxy as husband .  Patient does not have a living will at present time. Breast cancer: every Fall  BRCA gene screening: N/A Cervical cancer screening: up to date   Osteoporosis Screening: discussed high calcium diet and vitamin D supplementation   Lipids:  Lab Results  Component Value Date   CHOL 199 01/21/2018   CHOL 240 (H) 12/09/2015   CHOL 207 (H) 05/04/2015   Lab Results  Component Value Date   HDL 49 (L) 01/21/2018   HDL 59 12/09/2015   HDL 49 05/04/2015   Lab Results   Component Value Date   LDLCALC 109 (H) 01/21/2018   LDLCALC 161 (H) 12/09/2015   LDLCALC 137 (H) 05/04/2015   Lab Results  Component Value Date   TRIG 286 (H) 01/21/2018   TRIG 102 12/09/2015   TRIG 106 05/04/2015   Lab Results  Component Value Date   CHOLHDL 4.1 01/21/2018   CHOLHDL 4.1 12/09/2015   CHOLHDL 4.2 05/04/2015   No results found for: LDLDIRECT  Glucose:  Glucose  Date Value Ref Range Status  03/17/2013 126 (H) 65 - 99 mg/dL Final   Glucose, Bld  Date Value Ref Range Status  01/21/2018 107 65 - 139 mg/dL Final    Comment:    .        Non-fasting reference interval .   12/09/2015 107 (H) 65 - 99 mg/dL Final  05/04/2015 119 (H) 65 - 99 mg/dL Final    Skin cancer: discussed atypical lesions  Colorectal cancer: up to date  Lung cancer:   Low Dose CT Chest recommended if Age 72-80 years, 30 pack-year currently smoking OR have quit w/in 15years. Patient does not qualify.   ECG:12/2017  Patient Active Problem List   Diagnosis Date Noted  . B12 deficiency 01/21/2018  . Essential hypertension 05/10/2015  . Dysmetabolic syndrome 78/46/9629  . Neuropathy 10/28/2014  . Obesity (BMI 30-39.9) 10/28/2014  . Symptomatic menopausal or female climacteric states 10/28/2014  . Fibrocystic breast 11/04/2012    Past  Surgical History:  Procedure Laterality Date  . BREAST CYST ASPIRATION Left 1990s and 2012   Dr Jamal Collin  . BREAST SURGERY Left 04/2002   biopsy-benign  . CESAREAN SECTION  1996  . CESAREAN SECTION and Bilateral Tubal Ligation  1999  . COLONOSCOPY  2013   Dr. Vira Agar  . DILATION AND CURETTAGE OF UTERUS  1995/ 1985   blighted ovum-CAK and incomplete AB-Dr CAK  . ENDOMETRIAL BIOPSY  03/2009   proliferative  . TONSILLECTOMY  1979    Family History  Problem Relation Age of Onset  . Cancer Father        lung  . Diabetes Father   . Heart disease Father   . Hypertension Father   . Hypertension Brother   . Prostate cancer Maternal Grandfather    . Hypertension Sister   . Breast cancer Neg Hx     Social History   Socioeconomic History  . Marital status: Married    Spouse name: Not on file  . Number of children: 2  . Years of education: Not on file  . Highest education level: Associate degree: academic program  Occupational History  . Occupation: RN-surgical floor at [employer]  . Financial resource strain: Not hard at all  . Food insecurity    Worry: Never true    Inability: Never true  . Transportation needs    Medical: No    Non-medical: No  Tobacco Use  . Smoking status: Never Smoker  . Smokeless tobacco: Never Used  Substance and Sexual Activity  . Alcohol use: No    Alcohol/week: 0.0 standard drinks  . Drug use: No  . Sexual activity: Yes    Partners: Male    Birth control/protection: Post-menopausal  Lifestyle  . Physical activity    Days per week: 0 days    Minutes per session: 0 min  . Stress: Not at all  Relationships  . Social connections    Talks on phone: More than three times a week    Gets together: More than three times a week    Attends religious service: More than 4 times per year    Active member of club or organization: No    Attends meetings of clubs or organizations: Never    Relationship status: Married  . Intimate partner violence    Fear of current or ex partner: No    Emotionally abused: No    Physically abused: No    Forced sexual activity: No  Other Topics Concern  . 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  DiphenhydrAMINE HCl (BENADRYL ALLERGY PO), Take 1 tablet by mouth as needed. , Disp: , Rfl:  .  ibuprofen (ADVIL,MOTRIN) 200 MG tablet, Take by mouth., Disp: , Rfl:  .  triamterene-hydrochlorothiazide (MAXZIDE-25) 37.5-25 MG tablet, Take 1 tablet by mouth daily., Disp: 90 tablet, Rfl: 1 .  Cyanocobalamin (VITAMIN B-12) 1000 MCG SUBL, Place 1 tablet  (1,000 mcg total) under the tongue daily. (Patient not taking: Reported on 01/01/2019), Disp: 100 tablet, Rfl: 1 .  loratadine (CLARITIN) 10 MG tablet, Take 10 mg by mouth daily., Disp: , Rfl:   No Known Allergies   ROS  Constitutional: Negative for fever or significant weight change.  Respiratory: Negative for cough and shortness of breath.   Cardiovascular: Negative for chest pain or palpitations.  Gastrointestinal: Negative  for abdominal pain, no bowel changes.  Musculoskeletal: Negative for gait problem or joint swelling.  Skin: Negative for rash.  Neurological: Negative for dizziness or headache.  No other specific complaints in a complete review of systems (except as listed in HPI above).  Objective  Vitals:   01/01/19 1554  BP: 124/76  Pulse: 67  Resp: 16  Temp: (!) 96.9 F (36.1 C)  TempSrc: Temporal  SpO2: 98%  Weight: 210 lb 12.8 oz (95.6 kg)  Height: _0  (1.575 m)    Body mass index is 38.56 kg/m.  Physical Exam  Constitutional: Patient appears well-developed and well-nourished. No distress.  HENT: Head: Normocephalic and atraumatic. Ears: B TMs ok, no erythema or effusion Eyes: Conjunctivae and EOM are normal. Pupils are equal, round, and reactive to light. No scleral icterus.  Neck: Normal range of motion. Neck supple. No JVD present. No thyromegaly present.  Cardiovascular: Normal rate, regular rhythm and normal heart sounds.  No murmur heard. No BLE edema. Pulmonary/Chest: Effort normal and breath sounds normal. No respiratory distress. Abdominal: Soft. Bowel sounds are normal, no distension. There is no tenderness. no masses Breast: no lumps or masses, no nipple discharge or rashes FEMALE GENITALIA:  Not done, sees gyn  RECTAL: not done Musculoskeletal: Normal range of motion, no joint effusions. No gross deformities Neurological: he is alert and oriented to person, place, and time. No cranial nerve deficit. Coordination, balance, strength, speech and  gait are normal.  Skin: Skin is warm and dry. No rash noted. No erythema. skin tags Psychiatric: Patient has a normal mood and affect. behavior is normal. Judgment and thought content normal.  PHQ2/9: Depression screen [facility] 2/9 01/01/2019 09/02/2018 01/21/2018 06/12/2016 12/08/2015  Decreased Interest 0 0 0 0 0  Down, Depressed, Hopeless 0 0 0 0 0  PHQ - 2 Score 0 0 0 0 0  Altered sleeping 0 0 - - -  Tired, decreased energy 0 0 - - -  Change in appetite 0 0 - - -  Feeling bad or failure about yourself  0 0 - - -  Trouble concentrating 0 0 - - -  Moving slowly or fidgety/restless 0 0 - - -  Suicidal thoughts 0 0 - - -  PHQ-9 Score 0 0 - - -  Difficult doing work/chores Not difficult at all - - - -     Fall Risk: Fall Risk  01/01/2019 09/02/2018 01/21/2018 06/12/2016 12/08/2015  Falls in the past year? 0 0 No No No  Number falls in past yr: 0 0 - - -  Injury with Fall? 0 0 - - -     Functional Status Survey: Is the patient deaf or have difficulty hearing?: No Does the patient have difficulty seeing, even when wearing glasses/contacts?: Yes Does the patient have difficulty concentrating, remembering, or making decisions?: No Does the patient have difficulty walking or climbing stairs?: No Does the patient have difficulty dressing or bathing?: No Does the patient have difficulty doing errands alone such as visiting a doctor's office or shopping?: No   Assessment & Plan  1. Well woman exam   2. Dyslipidemia  - Lipid panel  3. Dysmetabolic syndrome  - Hemoglobin A1c  4. Essential hypertension  - COMPLETE METABOLIC PANEL WITH GFR - CBC with Differential/Platelet   -USPSTF grade A and B recommendations reviewed with patient; age-appropriate recommendations, preventive care, screening tests, etc discussed and encouraged; healthy living encouraged; see AVS for patient education given to patient -Discussed importance of 150  minutes of physical activity weekly, eat two servings of fish  weekly, eat one serving of tree nuts ( cashews, pistachios, pecans, almonds.Marland Kitchen) every other day, eat 6 servings of fruit/vegetables daily and drink plenty of water and avoid sweet beverages.

## 2019-01-02 LAB — COMPLETE METABOLIC PANEL WITH GFR
AG Ratio: 2 (calc) (ref 1.0–2.5)
ALT: 28 U/L (ref 6–29)
AST: 17 U/L (ref 10–35)
Albumin: 4.4 g/dL (ref 3.6–5.1)
Alkaline phosphatase (APISO): 89 U/L (ref 37–153)
BUN/Creatinine Ratio: 30 (calc) — ABNORMAL HIGH (ref 6–22)
BUN: 26 mg/dL — ABNORMAL HIGH (ref 7–25)
CO2: 33 mmol/L — ABNORMAL HIGH (ref 20–32)
Calcium: 10 mg/dL (ref 8.6–10.4)
Chloride: 98 mmol/L (ref 98–110)
Creat: 0.88 mg/dL (ref 0.50–1.05)
GFR, Est African American: 84 mL/min/{1.73_m2} (ref >=60)
GFR, Est Non African American: 72 mL/min/{1.73_m2} (ref >=60)
Globulin: 2.2 g/dL (calc) (ref 1.9–3.7)
Glucose, Bld: 91 mg/dL (ref 65–99)
Potassium: 3.6 mmol/L (ref 3.5–5.3)
Sodium: 139 mmol/L (ref 135–146)
Total Bilirubin: 1 mg/dL (ref 0.2–1.2)
Total Protein: 6.6 g/dL (ref 6.1–8.1)

## 2019-01-02 LAB — LIPID PANEL
Cholesterol: 234 mg/dL — ABNORMAL HIGH (ref <200)
HDL: 49 mg/dL — ABNORMAL LOW (ref >=50)
LDL Cholesterol (Calc): 144 mg/dL (calc) — ABNORMAL HIGH
Non-HDL Cholesterol (Calc): 185 mg/dL (calc) — ABNORMAL HIGH (ref <130)
Total CHOL/HDL Ratio: 4.8 (calc) (ref <5.0)
Triglycerides: 268 mg/dL — ABNORMAL HIGH (ref <150)

## 2019-01-02 LAB — CBC WITH DIFFERENTIAL/PLATELET
Absolute Monocytes: 1198 cells/uL — ABNORMAL HIGH (ref 200–950)
Basophils Absolute: 85 cells/uL (ref 0–200)
Basophils Relative: 0.8 %
Eosinophils Absolute: 191 cells/uL (ref 15–500)
Eosinophils Relative: 1.8 %
HCT: 46.1 % — ABNORMAL HIGH (ref 35.0–45.0)
Hemoglobin: 15.3 g/dL (ref 11.7–15.5)
Lymphs Abs: 2968 cells/uL (ref 850–3900)
MCH: 29 pg (ref 27.0–33.0)
MCHC: 33.2 g/dL (ref 32.0–36.0)
MCV: 87.3 fL (ref 80.0–100.0)
MPV: 11.6 fL (ref 7.5–12.5)
Monocytes Relative: 11.3 %
Neutro Abs: 6159 cells/uL (ref 1500–7800)
Neutrophils Relative %: 58.1 %
Platelets: 251 10*3/uL (ref 140–400)
RBC: 5.28 10*6/uL — ABNORMAL HIGH (ref 3.80–5.10)
RDW: 13.6 % (ref 11.0–15.0)
Total Lymphocyte: 28 %
WBC: 10.6 10*3/uL (ref 3.8–10.8)

## 2019-01-02 LAB — HEMOGLOBIN A1C
Hgb A1c MFr Bld: 6.2 % of total Hgb — ABNORMAL HIGH (ref <5.7)
Mean Plasma Glucose: 131 (calc)
eAG (mmol/L): 7.3 (calc)

## 2019-02-21 ENCOUNTER — Encounter: Payer: Self-pay | Admitting: Family Medicine

## 2019-02-21 ENCOUNTER — Ambulatory Visit (INDEPENDENT_AMBULATORY_CARE_PROVIDER_SITE_OTHER): Payer: No Typology Code available for payment source | Admitting: Family Medicine

## 2019-02-21 ENCOUNTER — Other Ambulatory Visit: Payer: Self-pay

## 2019-02-21 DIAGNOSIS — L918 Other hypertrophic disorders of the skin: Secondary | ICD-10-CM | POA: Diagnosis not present

## 2019-02-21 DIAGNOSIS — E538 Deficiency of other specified B group vitamins: Secondary | ICD-10-CM

## 2019-02-21 DIAGNOSIS — E785 Hyperlipidemia, unspecified: Secondary | ICD-10-CM | POA: Diagnosis not present

## 2019-02-21 DIAGNOSIS — I10 Essential (primary) hypertension: Secondary | ICD-10-CM | POA: Diagnosis not present

## 2019-02-21 DIAGNOSIS — E8881 Metabolic syndrome: Secondary | ICD-10-CM

## 2019-02-21 MED ORDER — LIDOCAINE HCL (PF) 1 % IJ SOLN
2.0000 mL | Freq: Once | INTRAMUSCULAR | Status: AC
  Administered 2019-02-21: 2 mL

## 2019-02-21 MED ORDER — TRIAMTERENE-HCTZ 37.5-25 MG PO TABS: 1.0000 | ORAL_TABLET | Freq: Every day | ORAL | 1 refills | Status: DC

## 2019-02-21 NOTE — Progress Notes (Signed)
Name: Rachel Davidson   MRN: [id number]    DOB: 02/29/1961   Date:02/21/2019       Progress Note  Subjective  Chief Complaint  Chief Complaint  Patient presents with  . skin tag    HPI  HTN:BP is at goal todayShe denies chest pain,palpitationorSOB. Mild lower extremity edema at the end of her shift, but that is unchanged   Metabolic Syndrome: last HYIF0Y was stable at  6.2%. She denies polyphagia, polydipsia or polyuria, discussed a diabetic diet and to consider a carbohydrate restrictive diet   B12 deficiency: states very seldom has burning on her feet, taking otc supplementation We did not check labs this time but it was normal last year  Morbid Obesity: she has lost a few more pounds  since last visit.  She states since COVID-19 she has a better routine, not eating right before bed. Explained that she has dyslipidemia, HTN, pre-diabetes , knee pain and losing weight is beneficial to her, went over dietary modificaitons  Dyslipidemia: no need for statins at this time, but discussed a healthy life style   The 10-year ASCVD risk score Mikey Bussing DC Brooke Bonito., et al., 2013) is: 4%   Values used to calculate the score:     Age: 58 years     Sex: Female     Is Non-Hispanic African American: No     Diabetic: No     Tobacco smoker: No     Systolic Blood Pressure: 774 mmHg     Is BP treated: Yes     HDL Cholesterol: 49 mg/dL     Total Cholesterol: 234 mg/dL     Patient Active Problem List   Diagnosis Date Noted  . B12 deficiency 01/21/2018  . Essential hypertension 05/10/2015  . Dysmetabolic syndrome 12/87/8676  . Neuropathy 10/28/2014  . Obesity (BMI 30-39.9) 10/28/2014  . Symptomatic menopausal or female climacteric states 10/28/2014  . Fibrocystic breast 11/04/2012    Social History   Tobacco Use  . Smoking status: Never Smoker  . Smokeless tobacco: Never Used  Substance Use Topics  . Alcohol use: No    Alcohol/week: 0.0 standard drinks     Current  Outpatient Medications:  .  DiphenhydrAMINE HCl (BENADRYL ALLERGY PO), Take 1 tablet by mouth as needed. , Disp: , Rfl:  .  ibuprofen (ADVIL,MOTRIN) 200 MG tablet, Take by mouth., Disp: , Rfl:  .  loratadine (CLARITIN) 10 MG tablet, Take 10 mg by mouth daily., Disp: , Rfl:  .  triamterene-hydrochlorothiazide (MAXZIDE-25) 37.5-25 MG tablet, Take 1 tablet by mouth daily., Disp: 90 tablet, Rfl: 1 .  Cyanocobalamin (VITAMIN B-12) 1000 MCG SUBL, Place 1 tablet (1,000 mcg total) under the tongue daily. (Patient not taking: Reported on 01/01/2019), Disp: 100 tablet, Rfl: 1  No Known Allergies  ROS  Constitutional: Negative for fever or weight change.  Respiratory: Negative for cough and shortness of breath.   Cardiovascular: Negative for chest pain or palpitations.  Gastrointestinal: Negative for abdominal pain, no bowel changes.  Musculoskeletal: Negative for gait problem or joint swelling.  Skin: Negative for rash.  Neurological: Negative for dizziness or headache.  No other specific complaints in a complete review of systems (except as listed in HPI above).  Objective  Vitals:   02/21/19 1133  BP: 120/84  Pulse: 73  Resp: 14  Temp: (!) 96.8 F (36 C)  SpO2: 96%  Weight: 205 lb 3.2 oz (93.1 kg)    Body mass index is 37.53 kg/m.  Physical Exam  Constitutional: Patient appears well-developed and well-nourished. Obese No distress.  HEENT: head atraumatic, normocephalic, pupils equal and reactive to light Cardiovascular: Normal rate, regular rhythm and normal heart sounds.  No murmur heard. No BLE edema. Pulmonary/Chest: Effort normal and breath sounds normal. No respiratory distress. Abdominal: Soft.  There is no tenderness. Skin: multiple skin tags, we chose 12 to remove, two on her neck and the others on both axillary regions. Discussed sending to pathology but we decided not to at this time Psychiatric: Patient has a normal mood and affect. behavior is normal. Judgment and  thought content normal.  Recent Results (from the past 2160 hour(s))  Lipid panel     Status: Abnormal   Collection Time: 01/01/19  4:36 PM  Result Value Ref Range   Cholesterol 234 (H) <200 mg/dL   HDL 49 (L) > OR = 50 mg/dL   Triglycerides 132 (H) <150 mg/dL    Comment: . If a non-fasting specimen was collected, consider repeat triglyceride testing on a fasting specimen if clinically indicated.  Perry Mount et al. J. of Clin. Lipidol. 2015;9:129-169. Marland Kitchen    LDL Cholesterol (Calc) 144 (H) mg/dL (calc)    Comment: Reference range: <100 . Desirable range <100 mg/dL for primary prevention;   <70 mg/dL for patients with CHD or diabetic patients  with > or = 2 CHD risk factors. Marland Kitchen LDL-C is now calculated using the Martin-Hopkins  calculation, which is a validated novel method providing  better accuracy than the Friedewald equation in the  estimation of LDL-C.  Horald Pollen et al. Lenox Ahr. 4401;027(25): 2061-2068  (http://education.[facility].com/faq/FAQ164)    Total CHOL/HDL Ratio 4.8 <5.0 (calc)   Non-HDL Cholesterol (Calc) 185 (H) <130 mg/dL (calc)    Comment: For patients with diabetes plus 1 major ASCVD risk  factor, treating to a non-HDL-C goal of <100 mg/dL  (LDL-C of <36 mg/dL) is considered a therapeutic  option.   COMPLETE METABOLIC PANEL WITH GFR     Status: Abnormal   Collection Time: 01/01/19  4:36 PM  Result Value Ref Range   Glucose, Bld 91 65 - 99 mg/dL    Comment: .            Fasting reference interval .    BUN 26 (H) 7 - 25 mg/dL   Creat 6.44 0.34 - 7.42 mg/dL    Comment: For patients >8 years of age, the reference limit for Creatinine is approximately 13% higher for people identified as African-American. .    GFR, Est Non African American 72 > OR = 60 mL/min/1.45m2   GFR, Est African American 84 > OR = 60 mL/min/1.30m2   BUN/Creatinine Ratio 30 (H) 6 - 22 (calc)   Sodium 139 135 - 146 mmol/L   Potassium 3.6 3.5 - 5.3 mmol/L   Chloride 98 98 - 110  mmol/L   CO2 33 (H) 20 - 32 mmol/L   Calcium 10.0 8.6 - 10.4 mg/dL   Total Protein 6.6 6.1 - 8.1 g/dL   Albumin 4.4 3.6 - 5.1 g/dL   Globulin 2.2 1.9 - 3.7 g/dL (calc)   AG Ratio 2.0 1.0 - 2.5 (calc)   Total Bilirubin 1.0 0.2 - 1.2 mg/dL   Alkaline phosphatase (APISO) 89 37 - 153 U/L   AST 17 10 - 35 U/L   ALT 28 6 - 29 U/L  CBC with Differential/Platelet     Status: Abnormal   Collection Time: 01/01/19  4:36 PM  Result Value Ref Range  WBC 10.6 3.8 - 10.8 Thousand/uL   RBC 5.28 (H) 3.80 - 5.10 Million/uL   Hemoglobin 15.3 11.7 - 15.5 g/dL   HCT 46.1 (H) 09.635.0 - 16.104.545.0 %   MCV 87.3 80.0 - 100.0 fL   MCH 29.0 27.0 - 33.0 pg   MCHC 33.2 32.0 - 36.0 g/dL   RDW 40.913.6 81.111.0 - 91.415.0 %   Platelets 251 140 - 400 Thousand/uL   MPV 11.6 7.5 - 12.5 fL   Neutro Abs 6,159 1,500 - 7,800 cells/uL   Lymphs Abs 2,968 850 - 3,900 cells/uL   Absolute Monocytes 1,198 (H) 200 - 950 cells/uL   Eosinophils Absolute 191 15 - 500 cells/uL   Basophils Absolute 85 0 - 200 cells/uL   Neutrophils Relative % 58.1 %   Total Lymphocyte 28.0 %   Monocytes Relative 11.3 %   Eosinophils Relative 1.8 %   Basophils Relative 0.8 %  Hemoglobin A1c     Status: Abnormal   Collection Time: 01/01/19  4:36 PM  Result Value Ref Range   Hgb A1c MFr Bld 6.2 (H) <5.7 % of total Hgb    Comment: For someone without known diabetes, a hemoglobin  A1c value between 5.7% and 6.4% is consistent with prediabetes and should be confirmed with a  follow-up test. . For someone with known diabetes, a value <7% indicates that their diabetes is well controlled. A1c targets should be individualized based on duration of diabetes, age, comorbid conditions, and other considerations. . This assay result is consistent with an increased risk of diabetes. . Currently, no consensus exists regarding use of hemoglobin A1c for diagnosis of diabetes for children. .    Mean Plasma Glucose 131 (calc)   eAG (mmol/L) 7.3 (calc)      Assessment & Plan   1. Essential hypertension  - triamterene-hydrochlorothiazide (MAXZIDE-25) 37.5-25 MG tablet; Take 1 tablet by mouth daily.  Dispense: 90 tablet; Refill: 1  2. Dyslipidemia   3. Skin tag  , Consent signed: YES  Procedure: Skin Mass Removal Location: anterior neck and axillary regions bilaterally, total of 12 removed  Equipment used:  high temperature cautery, sterile scalpel, tweezers, curved scissors Anesthesia: 1% Lidocaine w/o Epinephrine  Cleaned and prepped: alcohol  After consent signed, are of skin prepped with betadine. Lidocaine w/o epinephrine injected into skin underneath skin mass. After properly numbed sterile equipment used to remove tag. Instructed on proper care to allow for proper healing. F/U for nursing visit if needed.  4. Dysmetabolic syndrome   5. B12 deficiency  Needs to resume medication   6. Morbid obesity (HCC)  Discussed with the patient the risk posed by an increased BMI. Discussed importance of portion control, calorie counting and at least 150 minutes of physical activity weekly. Avoid sweet beverages and drink more water. Eat at least 6 servings of fruit and vegetables daily   Discussed carbohydrate restrictive diet, discussed importance  of drinking plenty of fluids and eating enough protein daily

## 2019-02-21 NOTE — Addendum Note (Signed)
Addended by: Inda Coke on: 02/21/2019 12:43 PM   Modules accepted: Orders

## 2019-03-19 ENCOUNTER — Other Ambulatory Visit: Payer: Self-pay | Admitting: Family Medicine

## 2019-03-19 DIAGNOSIS — Z1239 Encounter for other screening for malignant neoplasm of breast: Secondary | ICD-10-CM

## 2019-05-01 NOTE — Progress Notes (Signed)
Gynecology Annual Exam  PCP: Sowles, Krichna, MD  Chief Complaint:  Chief Complaint  Patient presAlba Davidson with  . Gynecologic Exam    History of Present Illness:Rachel Davidson presents today for her annual exam. She is a 58 year old Caucasian/White female , G 4 P 2 0 2 2. She is having no significant gyn problems.  Her menses are absent and she is postmenopausal. Her LMP was around 11/2013.  She has had no spotting.  The patient's past medical history is notable for obesity, prediabetes, hyperlipidemia,  and hypertension (takes Maxide). Her PCP is Rachel Davidson.  Since her last annual GYN exam dated 03/05/2018, she has had no significant changes in her health history.  She is sexually active. She does not have vaginal dryness.   Her most recent pap smear was obtained 06/28/2016 and was NIL. There is no history of abnormal Pap smears Her most recent mammogram obtained 03/25/2018 and was normal. She has a hx of fibrocystic changes and Rachel Davidson followed her for her mammograms and exams prior to his retirement. There is no family history of breast cancer. There is no family history of ovarian cancer. The patient does do monthly self breast exams.  She had a colonoscopy in 11/2010 and a hyperplastic polyp was removed.  Her next colonoscopy is due in 10 years.  She has not had a DEXA scan  The patient does not smoke.  The patient does not drink alcohol.  The patient does not use illegal drugs.  The patient is exercising by walking 20-30 minutes, 2-3 x/week. The patient may get adequate calcium in her diet.  She had a recent cholesterol screen in 2020 by Rachel Davidson that was remarkable for elevated triglycerides and borderline elevated TC. Her hemoglobin A1C was 6.2%.    Review of Systems: Review of Systems  Constitutional: Negative for chills, fever and weight loss.  HENT: Negative for congestion, sinus pain and sore throat.   Eyes: Negative for blurred vision and pain.  Respiratory:  Negative for hemoptysis, shortness of breath and wheezing.   Cardiovascular: Negative for chest pain, palpitations and leg swelling.  Gastrointestinal: Negative for abdominal pain, blood in stool, diarrhea, heartburn, nausea and vomiting.  Genitourinary: Negative for dysuria, frequency, hematuria and urgency.  Musculoskeletal: Positive for joint pain (knees and ankles). Negative for back pain and myalgias.  Skin: Negative for itching and rash.  Neurological: Negative for dizziness, tingling and headaches.  Endo/Heme/Allergies: Positive for environmental allergies. Negative for polydipsia. Does not bruise/bleed easily.          Psychiatric/Behavioral: Negative for depression. The patient is not nervous/anxious and does not have insomnia.     Past Medical History:  Past Medical History:  Diagnosis Date  . Diffuse cystic mastopathy 2012  . Hypertension   . Obesity, unspecified   . Special screening for malignant neoplasms, colon 2013    Past Surgical History:  Past Surgical History:  Procedure Laterality Date  . BREAST CYST ASPIRATION Left 1990s and 2012   Rachel Davidson  . BREAST SURGERY Left 04/2002   biopsy-benign  . CESAREAN SECTION  1996  . CESAREAN SECTION and Bilateral Tubal Ligation  1999  . COLONOSCOPY  2013   Rachel. Mechele Davidson  . DILATION AND CURETTAGE OF UTERUS  1995/ 1985   blighted ovum-CAK and incomplete AB-Rachel CAK  . ENDOMETRIAL BIOPSY  03/2009   proliferative  . TONSILLECTOMY  1979    Family History:  Family History  Problem Relation Age  of Onset  . Cancer Father        lung  . Diabetes Father   . Heart disease Father   . Hypertension Father   . Hypertension Brother   . Prostate cancer Maternal Grandfather   . Hypertension Sister   . Breast cancer Neg Hx     Social History:  Social History   Socioeconomic History  . Marital status: Married    Spouse name: Not on file  . Number of children: 2  . Years of education: Not on file  . Highest education level:  Associate degree: academic program  Occupational History  . Occupation: RN-surgical floor at [employer]  . Financial resource strain: Not hard at all  . Food insecurity    Worry: Never true    Inability: Never true  . Transportation needs    Medical: No    Non-medical: No  Tobacco Use  . Smoking status: Never Smoker  . Smokeless tobacco: Never Used  Substance and Sexual Activity  . Alcohol use: No    Alcohol/week: 0.0 standard drinks  . Drug use: No  . Sexual activity: Yes    Partners: Male    Birth control/protection: Post-menopausal  Lifestyle  . Physical activity    Days per week: 3 days    Minutes per session: 30 min  . Stress: Not at all  Relationships  . Social connections    Talks on phone: More than three times a week    Gets together: More than three times a week    Attends religious service: More than 4 times per year    Active member of club or organization: Yes    Attends meetings of clubs or organizations: 1 to 4 times per year    Relationship status: Married  . Intimate partner violence    Fear of current or ex partner: No    Emotionally abused: No    Physically abused: No    Forced sexual activity: No  Other Topics Concern  . 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 Authorizing Provider  DiphenhydrAMINE HCl (BENADRYL ALLERGY PO) Take 1 tablet by mouth as needed.     [provider]  ibuprofen (ADVIL,MOTRIN) 200 MG tablet Take by mouth.    [provider]         triamterene-hydrochlorothiazide (MAXZIDE-25) 37.5-25 MG tablet Take 1 tablet by mouth daily. 01/21/18   Steele Sizer, MD    Physical Exam Vitals:BP 130/80   Ht 5\' 2"  (1.575 m)   Wt 211 lb (95.7 kg)   LMP 07/12/2011 Comment: Pt states  she only weighs at PCP.  BMI 38.59 kg/m   General: WF in NAD HEENT: normocephalic, anicteric Neck: no thyroid enlargement, no palpable nodules, no cervical lymphadenopathy  Pulmonary: No increased work of breathing, CTAB Cardiovascular: RRR, without murmur  Breast: Breast symmetrical, no tenderness, no palpable nodules or masses, no skin or nipple retraction present, no nipple discharge.  No axillary, infraclavicular or supraclavicular lymphadenopathy. Abdomen: Soft, obese,non-tender, non-distended.  Umbilicus without lesions.  No hepatomegaly or masses palpable. No evidence of hernia. Genitourinary:  External: Normal external female genitalia.  Normal urethral meatus, normal Bartholin's and Skene's glands.    Vagina: Normal vaginal mucosa, no  evidence of prolapse.    Cervix:  no bleeding, non-tender  Uterus: Anteverted, small, mobile, and non-tender  Adnexa: No adnexal masses, non-tender  Rectal: deferred  Lymphatic: no evidence of inguinal lymphadenopathy Extremities: no edema, erythema, or tenderness Neurologic: Grossly intact Psychiatric: mood appropriate, affect full     Assessment: 58 y.o. [id number] normal postmenopausal gyn exam   Plan:     1) Breast cancer screening - recommend monthly self breast exam and annual screening mammograms Mammogram was ordered today. Patient to schedule at [facility]  2) Colon cancer screening: colonoscopy 2012-next due 2022.  3) Cervical cancer screening - Pap done. ASCCP guidelines and rational discussed.  Patient opts for every 2-3 years screening interval  4) Routine healthcare maintenance including cholesterol and diabetes screening managed by PCP   5) Discussed getting Dexa scan next year.  5) RTO 1 year and prn  Farrel Conners, CNM

## 2019-05-02 ENCOUNTER — Ambulatory Visit (INDEPENDENT_AMBULATORY_CARE_PROVIDER_SITE_OTHER): Payer: No Typology Code available for payment source | Admitting: Certified Nurse Midwife

## 2019-05-02 ENCOUNTER — Other Ambulatory Visit: Payer: Self-pay

## 2019-05-02 ENCOUNTER — Encounter: Payer: Self-pay | Admitting: Certified Nurse Midwife

## 2019-05-02 ENCOUNTER — Other Ambulatory Visit (HOSPITAL_COMMUNITY)
Admission: RE | Admit: 2019-05-02 | Discharge: 2019-05-02 | Disposition: A | Discharge status: Home / Self Care | Payer: No Typology Code available for payment source | Source: Ambulatory Visit | Attending: Certified Nurse Midwife | Admitting: Certified Nurse Midwife

## 2019-05-02 VITALS — BP 130/80 | Ht 62.0 in | Wt 211.0 lb

## 2019-05-02 DIAGNOSIS — Z01419 Encounter for gynecological examination (general) (routine) without abnormal findings: Secondary | ICD-10-CM

## 2019-05-02 DIAGNOSIS — Z1239 Encounter for other screening for malignant neoplasm of breast: Secondary | ICD-10-CM

## 2019-05-02 DIAGNOSIS — Z124 Encounter for screening for malignant neoplasm of cervix: Secondary | ICD-10-CM

## 2019-05-03 ENCOUNTER — Encounter: Payer: Self-pay | Admitting: Certified Nurse Midwife

## 2019-05-06 LAB — CYTOLOGY - PAP
Comment: NEGATIVE
Diagnosis: NEGATIVE
High risk HPV: NEGATIVE

## 2019-05-20 ENCOUNTER — Ambulatory Visit
Admission: RE | Admit: 2019-05-20 | Discharge: 2019-05-20 | Disposition: A | Discharge status: Home / Self Care | Payer: No Typology Code available for payment source | Source: Ambulatory Visit | Attending: Certified Nurse Midwife | Admitting: Certified Nurse Midwife

## 2019-05-20 DIAGNOSIS — Z1239 Encounter for other screening for malignant neoplasm of breast: Secondary | ICD-10-CM

## 2019-05-20 DIAGNOSIS — Z1231 Encounter for screening mammogram for malignant neoplasm of breast: Secondary | ICD-10-CM | POA: Insufficient documentation

## 2019-05-29 ENCOUNTER — Encounter: Payer: Self-pay | Admitting: Family Medicine

## 2019-06-04 ENCOUNTER — Encounter: Payer: Self-pay | Admitting: Family Medicine

## 2019-06-09 ENCOUNTER — Telehealth: Payer: No Typology Code available for payment source | Admitting: Physician Assistant

## 2019-06-09 DIAGNOSIS — R05 Cough: Secondary | ICD-10-CM

## 2019-06-09 DIAGNOSIS — R059 Cough, unspecified: Secondary | ICD-10-CM

## 2019-06-09 DIAGNOSIS — U071 COVID-19: Secondary | ICD-10-CM

## 2019-06-09 DIAGNOSIS — R509 Fever, unspecified: Secondary | ICD-10-CM | POA: Diagnosis not present

## 2019-06-09 MED ORDER — PROMETHAZINE-DM 6.25-15 MG/5ML PO SYRP: 5.0000 mL | ORAL_SOLUTION | Freq: Four times a day (QID) | ORAL | 0 refills | Status: DC | PRN

## 2019-06-09 MED ORDER — BENZONATATE 100 MG PO CAPS: 100.0000 mg | ORAL_CAPSULE | Freq: Three times a day (TID) | ORAL | 0 refills | Status: DC | PRN

## 2019-06-09 NOTE — Progress Notes (Signed)
Good morning Rachel Davidson,   I am sorry you are not feeling well.  Sounds like you are doing exactly what you should be doing: fever-reducing medications and staying well hydrated. I have prescribed two medications to help you with your cough (see below).   Unfortunately there is not a lot we can do via e-visit for patients with persistent symptoms including fever and body aches.  It is not uncommon for fever, cough, body aches to appear 2-14 days after exposure. However, if fever/cough/breathlessness are severe or illness seems like a threat to life, I recommend proceeding to the nearest emergency department.    You have been enrolled in [place] for COVID-19. Daily you will receive a questionnaire within the [place] website. Our COVID-19 response team will be monitoring your responses daily.  Please continue isolation at home, for at least 10 days since the start of your symptoms and until you have had 24 hours with no fever (without taking a fever reducer) and with improving of symptoms.  It is vitally important that if you feel that you have an infection such as this virus or any other virus that you stay home and away from places where you may spread it to others.  You should avoid contact with people age 60 and older.     Please continue good preventive care measures, including:  frequent hand-washing, avoid touching your face, cover coughs/sneezes, stay out of crowds and keep a 6 foot distance from others.     You can use medication such as A prescription cough medication called Tessalon Perles 100 mg. You may take 1-2 capsules every 8 hours as needed for cough and A prescription cough medication called Phenergan DM 6.25 mg/15 mg. You make take one teaspoon / 5 ml every 4-6 hours as needed for cough   Recheck or go to the nearest hospital ED tent for re-assessment if fever/cough/breathlessness persist or return.  The following symptoms may appear 2-14 days after  exposure: . Fever . Cough . Shortness of breath or difficulty breathing . Chills . Repeated shaking with chills . Muscle pain . Headache . Sore throat . New loss of taste or smell . Fatigue . Congestion or runny nose . Nausea or vomiting . Diarrhea  You may also take acetaminophen (Tylenol) as needed for fever.  Reduce your risk of any infection by using the same precautions used for avoiding the common cold or flu:  Marland Kitchen Wash your hands often with soap and warm water for at least 20 seconds.  If soap and water are not readily available, use an alcohol-based hand sanitizer with at least 60% alcohol.  . If coughing or sneezing, cover your mouth and nose by coughing or sneezing into the elbow areas of your shirt or coat, into a tissue or into your sleeve (not your hands). . Avoid shaking hands with others and consider head nods or verbal greetings only. . Avoid touching your eyes, nose, or mouth with unwashed hands.  . Avoid close contact with people who are sick. . Avoid places or events with large numbers of people in one location, like concerts or sporting events. . Carefully consider travel plans you have or are making. . If you are planning any travel outside or inside the [place], visit the CDC's Travelers' Health webpage for the latest health notices. . If you have some symptoms but not all symptoms, continue to monitor at home and seek medical attention if your symptoms worsen. . If you are having  a medical emergency, call 911.  HOME CARE . Only take medications as instructed by your medical team. . Drink plenty of fluids and get plenty of rest. . A steam or ultrasonic humidifier can help if you have congestion.   GET HELP RIGHT AWAY IF YOU HAVE EMERGENCY WARNING SIGNS** FOR COVID-19. If you or someone is showing any of these signs seek emergency medical care immediately. Call 911 or proceed to your closest emergency facility if: . You develop worsening high fever. . Trouble  breathing . Bluish lips or face . Persistent pain or pressure in the chest . New confusion . Inability to wake or stay awake . You cough up blood. . Your symptoms become more severe  **This list is not all possible symptoms. Contact your medical provider for any symptoms that are sever or concerning to you.  MAKE SURE YOU   Understand these instructions.  Will watch your condition.  Will get help right away if you are not doing well or get worse.  Your e-visit answers were reviewed by a board certified advanced clinical practitioner to complete your personal care plan.  Depending on the condition, your plan could have included both over the counter or prescription medications.  If there is a problem please reply once you have received a response from your provider.  Your safety is important to [place].  If you have drug allergies check your prescription carefully.    You can use MyChart to ask questions about today's visit, request a non-urgent call back, or ask for a work or school excuse for 24 hours related to this e-Visit. If it has been greater than 24 hours you will need to follow up with your provider, or enter a new e-Visit to address those concerns. You will get an e-mail in the next two days asking about your experience.  I hope that your e-visit has been valuable and will speed your recovery. Thank you for using e-visits.  Greater than 5 minutes, yet less than 10 minutes of time have been spent researching, coordinating and implementing care for this patient today.

## 2019-06-17 ENCOUNTER — Encounter: Payer: Self-pay | Admitting: Family Medicine

## 2019-06-17 ENCOUNTER — Other Ambulatory Visit: Payer: Self-pay

## 2019-06-17 ENCOUNTER — Ambulatory Visit (INDEPENDENT_AMBULATORY_CARE_PROVIDER_SITE_OTHER): Payer: No Typology Code available for payment source | Admitting: Family Medicine

## 2019-06-17 VITALS — BP 131/89 | HR 96 | Temp 98.1°F | Resp 36 | Wt 199.4 lb

## 2019-06-17 DIAGNOSIS — R05 Cough: Secondary | ICD-10-CM

## 2019-06-17 DIAGNOSIS — U071 COVID-19: Secondary | ICD-10-CM

## 2019-06-17 DIAGNOSIS — R0602 Shortness of breath: Secondary | ICD-10-CM

## 2019-06-17 DIAGNOSIS — R058 Other specified cough: Secondary | ICD-10-CM

## 2019-06-17 MED ORDER — SPIRIVA HANDIHALER 18 MCG IN CAPS: 18.0000 ug | ORAL_CAPSULE | Freq: Every day | RESPIRATORY_TRACT | 0 refills | Status: DC

## 2019-06-17 MED ORDER — AZITHROMYCIN 250 MG PO TABS: ORAL_TABLET | ORAL | 0 refills | Status: DC

## 2019-06-17 NOTE — Progress Notes (Signed)
Name: Rachel Davidson   MRN: [id number]    DOB: April 15, 1961   Date:06/17/2019       Progress Note  Subjective  Chief Complaint  Chief Complaint  Patient presents with  . Covid Follow up    She no longer has a fever. She still has cough, sob with exertion. Sob is worse after coughing. She can't carry on a conversation for very long. She is fine as long as she is sitting and is not doing anything. She has a productive cough and it is clear and think.    I connected with  Barbaraann Faster  on 06/17/19 at  8:00 AM EST by a video enabled telemedicine application and verified that I am speaking with the correct person using two identifiers.  I discussed the limitations of evaluation and management by telemedicine and the availability of in person appointments. The patient expressed understanding and agreed to proceed. Staff also discussed with the patient that there may be a patient responsible charge related to this service. 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 She thinks she got exposed by her mother who was admitted on 12/30 for COVID-19. Patient's symptoms started on 06/05/2019 with chills, Tmax was 102, but mostly around 101 for about one week. First time without a fever was 3 days ago. She has a productive cough, coughing spells, SOB when talking and has to pause. She has noticed lack of taste and is gradually improving, she lost 11 lbs since she got sick, she also has diarrhea this past week. She was advised to go back to work this week, but patient states she does not feel like she can perform her duties at work at this time. She states she is gradually getting better, she thinks she may be able to go back to work next week. She denies calf pain, discussed increase risk of PE with COVID-19. We will also order a CXR. She will contact me back if no significant improvement of symptoms in  the next few days and we may need to check CT   Patient Active Problem List   Diagnosis Date Noted  . B12 deficiency 01/21/2018  . Essential hypertension 05/10/2015  . Dysmetabolic syndrome 53/29/9242  . Neuropathy 10/28/2014  . Obesity (BMI 30-39.9) 10/28/2014  . Symptomatic menopausal or female climacteric states 10/28/2014  . Fibrocystic breast 11/04/2012    Social History   Tobacco Use  . Smoking status: Never Smoker  . Smokeless tobacco: Never Used  Substance Use Topics  . Alcohol use: No    Alcohol/week: 0.0 standard drinks     Current Outpatient Medications:  .  benzonatate (TESSALON) 100 MG capsule, Take 1-2 capsules (100-200 mg total) by mouth 3 (three) times daily as needed for cough., Disp: 40 capsule, Rfl: 0 .  DiphenhydrAMINE HCl (BENADRYL ALLERGY PO), Take 1 tablet by mouth as needed. , Disp: , Rfl:  .  ibuprofen (ADVIL,MOTRIN) 200 MG tablet, Take by mouth., Disp: , Rfl:  .  promethazine-dextromethorphan (PROMETHAZINE-DM) 6.25-15 MG/5ML syrup, Take 5 mLs by mouth 4 (four) times daily as needed., Disp: 118 mL, Rfl: 0 .  triamterene-hydrochlorothiazide (MAXZIDE-25) 37.5-25 MG tablet, Take 1 tablet by mouth daily., Disp: 90 tablet, Rfl: 1  No Known Allergies  I personally reviewed active problem list, medication list, allergies, family history, social history with the patient/caregiver today.  ROS  Ten systems reviewed and is  negative except as mentioned in HPI   Objective  Virtual encounter, vitals obtained at home  Vitals:   06/17/19 0833  BP: 131/89  Pulse: 96  Resp: (!) 36  Temp: 98.1 F (36.7 C)  SpO2: 93%    Body mass index is 36.47 kg/m.  Nursing Note and Vital Signs reviewed.  Physical Exam  Awake, alert and oriented, seems SOB and had to take pauses when talking    Assessment & Plan   1. COVID-19 virus infection  - azithromycin (ZITHROMAX) 250 MG tablet; Take two first day and one daily after that  Dispense: 6 tablet; Refill:  0 - tiotropium (SPIRIVA HANDIHALER) 18 MCG inhalation capsule; Place 1 capsule (18 mcg total) into inhaler and inhale daily.  Dispense: 30 capsule; Refill: 0 - DG Chest 2 View; Future  2. SOB (shortness of breath)  - azithromycin (ZITHROMAX) 250 MG tablet; Take two first day and one daily after that  Dispense: 6 tablet; Refill: 0 - tiotropium (SPIRIVA HANDIHALER) 18 MCG inhalation capsule; Place 1 capsule (18 mcg total) into inhaler and inhale daily.  Dispense: 30 capsule; Refill: 0 - DG Chest 2 View; Future  3. Productive cough  - azithromycin (ZITHROMAX) 250 MG tablet; Take two first day and one daily after that  Dispense: 6 tablet; Refill: 0 - tiotropium (SPIRIVA HANDIHALER) 18 MCG inhalation capsule; Place 1 capsule (18 mcg total) into inhaler and inhale daily.  Dispense: 30 capsule; Refill: 0 - DG Chest 2 View; Future -Red flags and when to present for emergency care or RTC including fever >101.53F, chest pain, shortness of breath, new/worsening/un-resolving symptoms, reviewed with patient at time of visit. Follow up and care instructions discussed and provided in AVS. - I discussed the assessment and treatment plan with the patient. The patient was provided an opportunity to ask questions and all were answered. The patient agreed with the plan and demonstrated an understanding of the instructions.  I provided 25  minutes of non-face-to-face time during this encounter.  Ruel Favors, MD

## 2019-06-18 ENCOUNTER — Encounter: Payer: Self-pay | Admitting: Family Medicine

## 2019-06-19 ENCOUNTER — Ambulatory Visit
Admission: RE | Admit: 2019-06-19 | Discharge: 2019-06-19 | Disposition: A | Discharge status: Home / Self Care | Payer: No Typology Code available for payment source | Attending: Family Medicine | Admitting: Family Medicine

## 2019-06-19 ENCOUNTER — Other Ambulatory Visit: Payer: Self-pay

## 2019-06-19 ENCOUNTER — Ambulatory Visit
Admission: RE | Admit: 2019-06-19 | Discharge: 2019-06-19 | Disposition: A | Discharge status: Home / Self Care | Payer: No Typology Code available for payment source | Source: Ambulatory Visit | Attending: Family Medicine | Admitting: Family Medicine

## 2019-06-19 DIAGNOSIS — R0602 Shortness of breath: Secondary | ICD-10-CM

## 2019-06-19 DIAGNOSIS — R05 Cough: Secondary | ICD-10-CM | POA: Insufficient documentation

## 2019-06-19 DIAGNOSIS — U071 COVID-19: Secondary | ICD-10-CM

## 2019-06-19 DIAGNOSIS — R058 Other specified cough: Secondary | ICD-10-CM

## 2019-06-20 ENCOUNTER — Ambulatory Visit: Payer: No Typology Code available for payment source | Admitting: Family Medicine

## 2019-06-20 ENCOUNTER — Encounter: Payer: Self-pay | Admitting: Family Medicine

## 2019-06-20 ENCOUNTER — Ambulatory Visit (INDEPENDENT_AMBULATORY_CARE_PROVIDER_SITE_OTHER): Payer: No Typology Code available for payment source | Admitting: Family Medicine

## 2019-06-20 VITALS — BP 120/90 | HR 96 | Temp 96.2°F | Resp 16 | Ht 62.0 in | Wt 202.3 lb

## 2019-06-20 DIAGNOSIS — U071 COVID-19: Secondary | ICD-10-CM

## 2019-06-20 DIAGNOSIS — J1282 Pneumonia due to coronavirus disease 2019: Secondary | ICD-10-CM | POA: Diagnosis not present

## 2019-06-20 NOTE — Progress Notes (Signed)
Name: Rachel Davidson   MRN: [id number]    DOB: [birth year]   Date:06/20/2019       Progress Note  Subjective  Chief Complaint  Chief Complaint  Patient presents with  . SOB with exertion    Tomorrow will be last day of Azithromycin  . Cough  . Pneumonia    Shown on x-ray yesterday    HPI  COVID-19 Pneumonia: symptoms started on January 7 th , she is finishing a Zpack and also has Spiriva. She states cough has improved but she continues to have sob. Her pulse ox and heart rate are at goal today. She seems to be taking deep breaths and slightly faster than normla  but she also states the mask bothers her. She has to work on Monday but will be off for almost two weeks after that ( already had time off). Explained that some of the SOB may be due to deconditioning. Explained that cough can linger and as long as she is gradually improving she is doing well. Reviewed labs and CXR with patient.   Patient Active Problem List   Diagnosis Date Noted  . B12 deficiency 01/21/2018  . Essential hypertension 05/10/2015  . Dysmetabolic syndrome 25/95/6387  . Neuropathy 10/28/2014  . Obesity (BMI 30-39.9) 10/28/2014  . Symptomatic menopausal or female climacteric states 10/28/2014  . Fibrocystic breast 11/04/2012    Past Surgical History:  Procedure Laterality Date  . BREAST CYST ASPIRATION Left 1990s and 2012   Dr Jamal Collin  . BREAST SURGERY Left 04/2002   biopsy-benign  . CESAREAN SECTION  1996  . CESAREAN SECTION and Bilateral Tubal Ligation  1999  . COLONOSCOPY  12/16/2010   Dr. Elliott-hyperplastic polyp  . DILATION AND CURETTAGE OF UTERUS  1995/ 1985   blighted ovum-CAK and incomplete AB-Dr CAK  . ENDOMETRIAL BIOPSY  03/2009   proliferative  . TONSILLECTOMY  1979    Family History  Problem Relation Age of Onset  . Cancer Father        lung  . Diabetes Father   . Heart disease Father   . Hypertension Father   . Hypertension Brother   . Prostate cancer Maternal Grandfather    . Hypertension Sister   . Breast cancer Neg Hx       Current Outpatient Medications:  .  azithromycin (ZITHROMAX) 250 MG tablet, Take two first day and one daily after that, Disp: 6 tablet, Rfl: 0 .  benzonatate (TESSALON) 100 MG capsule, Take 1-2 capsules (100-200 mg total) by mouth 3 (three) times daily as needed for cough., Disp: 40 capsule, Rfl: 0 .  DiphenhydrAMINE HCl (BENADRYL ALLERGY PO), Take 1 tablet by mouth as needed. , Disp: , Rfl:  .  ibuprofen (ADVIL,MOTRIN) 200 MG tablet, Take by mouth., Disp: , Rfl:  .  promethazine-dextromethorphan (PROMETHAZINE-DM) 6.25-15 MG/5ML syrup, Take 5 mLs by mouth 4 (four) times daily as needed., Disp: 118 mL, Rfl: 0 .  tiotropium (SPIRIVA HANDIHALER) 18 MCG inhalation capsule, Place 1 capsule (18 mcg total) into inhaler and inhale daily., Disp: 30 capsule, Rfl: 0 .  triamterene-hydrochlorothiazide (MAXZIDE-25) 37.5-25 MG tablet, Take 1 tablet by mouth daily., Disp: 90 tablet, Rfl: 1  No Known Allergies  I personally reviewed active problem list, medication list, allergies, family history with the patient/caregiver today.   ROS  Ten systems reviewed and is negative except as mentioned in HPI  Objective  Vitals:   06/20/19 1038  BP: 120/90  Pulse: 96  Resp: 16  Temp: (!)  96.2 F (35.7 C)  TempSrc: Temporal  SpO2: 97%  Weight: 202 lb 4.8 oz (91.8 kg)  Height: 5\' 2"  (1.575 m)    Body mass index is 37 kg/m.  Physical Exam  Constitutional: Patient appears well-developed and well-nourished. Obese No distress.  HEENT: head atraumatic, normocephalic, pupils equal and reactive to light Cardiovascular: Normal rate, regular rhythm and normal heart sounds.  No murmur heard. No BLE edema. Pulmonary/Chest: Effort normal and breath sounds normal. No respiratory distress. Mild tachypnea.  Abdominal: Soft.  There is no tenderness. Psychiatric: Patient has a normal mood and affect. behavior is normal. Judgment and thought content  normal.  Recent Results (from the past 2160 hour(s))  Cytology - PAP     Status: None   Collection Time: 05/02/19 10:55 AM  Result Value Ref Range   High risk HPV Negative    Adequacy      Satisfactory for evaluation. The presence or absence of an   Adequacy      endocervical/transformation zone component cannot be determined because   Adequacy of atrophy.    Diagnosis      - Negative for intraepithelial lesion or malignancy (NILM)   Comment Normal Reference Range HPV - Negative       PHQ2/9: Depression screen [facility] 2/9 06/20/2019 06/17/2019 02/21/2019 01/01/2019 09/02/2018  Decreased Interest 0 0 0 0 0  Down, Depressed, Hopeless 0 0 0 0 0  PHQ - 2 Score 0 0 0 0 0  Altered sleeping 0 0 0 0 0  Tired, decreased energy 0 0 0 0 0  Change in appetite 0 0 0 0 0  Feeling bad or failure about yourself  0 0 0 0 0  Trouble concentrating 0 0 0 0 0  Moving slowly or fidgety/restless 0 0 0 0 0  Suicidal thoughts 0 0 0 0 0  PHQ-9 Score 0 0 0 0 0  Difficult doing work/chores - - Not difficult at all Not difficult at all -    phq 9 is negative    Fall Risk: Fall Risk  06/20/2019 06/17/2019 02/21/2019 01/01/2019 09/02/2018  Falls in the past year? 0 0 1 0 0  Number falls in past yr: 0 0 0 0 0  Injury with Fall? 0 0 1 0 0    Functional Status Survey: Is the patient deaf or have difficulty hearing?: No Does the patient have difficulty seeing, even when wearing glasses/contacts?: No Does the patient have difficulty concentrating, remembering, or making decisions?: No Does the patient have difficulty walking or climbing stairs?: No Does the patient have difficulty dressing or bathing?: No Does the patient have difficulty doing errands alone such as visiting a doctor's office or shopping?: No  Assessment & Plan  1. Pneumonia due to COVID-19 virus  Finish antibiotics. Continue spiriva, try to start walking to increase lung capacity and help with sob, return if no improvement of symptoms

## 2019-07-21 ENCOUNTER — Encounter: Payer: Self-pay | Admitting: Family Medicine

## 2019-09-16 ENCOUNTER — Emergency Department: Payer: PRIVATE HEALTH INSURANCE

## 2019-09-16 ENCOUNTER — Emergency Department
Admission: EM | Admit: 2019-09-16 | Discharge: 2019-09-16 | Disposition: A | Discharge status: Home / Self Care | Payer: PRIVATE HEALTH INSURANCE | Attending: Emergency Medicine | Admitting: Emergency Medicine

## 2019-09-16 ENCOUNTER — Other Ambulatory Visit: Payer: Self-pay

## 2019-09-16 DIAGNOSIS — Y990 Civilian activity done for income or pay: Secondary | ICD-10-CM | POA: Insufficient documentation

## 2019-09-16 DIAGNOSIS — M25511 Pain in right shoulder: Secondary | ICD-10-CM

## 2019-09-16 DIAGNOSIS — Y939 Activity, unspecified: Secondary | ICD-10-CM | POA: Insufficient documentation

## 2019-09-16 DIAGNOSIS — S4991XA Unspecified injury of right shoulder and upper arm, initial encounter: Secondary | ICD-10-CM | POA: Insufficient documentation

## 2019-09-16 DIAGNOSIS — Y929 Unspecified place or not applicable: Secondary | ICD-10-CM | POA: Insufficient documentation

## 2019-09-16 DIAGNOSIS — W010XXA Fall on same level from slipping, tripping and stumbling without subsequent striking against object, initial encounter: Secondary | ICD-10-CM | POA: Insufficient documentation

## 2019-09-16 DIAGNOSIS — I10 Essential (primary) hypertension: Secondary | ICD-10-CM | POA: Diagnosis not present

## 2019-09-16 NOTE — ED Provider Notes (Signed)
[facility] Emergency Department Provider Note       Time seen: ----------------------------------------- 9:44 AM on 09/16/2019 -----------------------------------------  I have reviewed the triage vital signs and the nursing notes.  HISTORY   Chief Complaint Shoulder Injury    HPI Rachel Davidson is a 59 y.o. female with a history of hypertension, obesity who presents to the ED for right shoulder pain.  Patient states she was working and tripped over an oxygen cord and injured her right shoulder.  Discomfort is 5 out of 10.  Past Medical History:  Diagnosis Date  . Diffuse cystic mastopathy 2012  . Hypertension   . Obesity, unspecified   . Special screening for malignant neoplasms, colon 2013    Patient Active Problem List   Diagnosis Date Noted  . B12 deficiency 01/21/2018  . Essential hypertension 05/10/2015  . Dysmetabolic syndrome 10/28/2014  . Neuropathy 10/28/2014  . Obesity (BMI 30-39.9) 10/28/2014  . Symptomatic menopausal or female climacteric states 10/28/2014  . Fibrocystic breast 11/04/2012    Past Surgical History:  Procedure Laterality Date  . BREAST CYST ASPIRATION Left 1990s and 2012   Dr Evette Cristal  . BREAST SURGERY Left 04/2002   biopsy-benign  . CESAREAN SECTION  1996  . CESAREAN SECTION and Bilateral Tubal Ligation  1999  . COLONOSCOPY  12/16/2010   Dr. Elliott-hyperplastic polyp  . DILATION AND CURETTAGE OF UTERUS  1995/ 1985   blighted ovum-CAK and incomplete AB-Dr CAK  . ENDOMETRIAL BIOPSY  03/2009   proliferative  . TONSILLECTOMY  1979    Allergies Patient has no known allergies.  Social History Social History   Tobacco Use  . Smoking status: Never Smoker  . Smokeless tobacco: Never Used  Substance Use Topics  . Alcohol use: No    Alcohol/week: 0.0 standard drinks  . Drug use: No    Review of Systems Cardiovascular: Negative for chest pain. Respiratory: Negative for shortness of  breath. Musculoskeletal: Positive for right shoulder pain Skin: Negative for rash. Neurological: Negative for headaches, focal weakness or numbness.  All systems negative/normal/unremarkable except as stated in the HPI  ____________________________________________   PHYSICAL EXAM:  VITAL SIGNS: ED Triage Vitals  Enc Vitals Group     BP 09/16/19 0904 (!) 149/82     Pulse Rate 09/16/19 0904 80     Resp 09/16/19 0904 20     Temp 09/16/19 0904 98.4 F (36.9 C)     Temp Source 09/16/19 0904 Oral     SpO2 09/16/19 0904 96 %     Weight 09/16/19 0859 200 lb (90.7 kg)     Height 09/16/19 0859 5\' 2"  (1.575 m)     Head Circumference --      Peak Flow --      Pain Score 09/16/19 0859 5     Pain Loc --      Pain Edu? --      Excl. in GC? --    Constitutional: Alert and oriented. Well appearing and in no distress. Cardiovascular: Normal rate, regular rhythm. No murmurs, rubs, or gallops. Respiratory: Normal respiratory effort without tachypnea nor retractions. Breath sounds are clear and equal bilaterally. No wheezes/rales/rhonchi. Gastrointestinal: Soft and nontender. Normal bowel sounds Musculoskeletal: Pain with range of motion of the right shoulder Neurologic:  Normal speech and language. No gross focal neurologic deficits are appreciated.  Skin:  Skin is warm, dry and intact. No rash noted. Psychiatric: Mood and affect are normal. Speech and behavior are normal.  ____________________________________________  ED COURSE:  As part of my medical decision making, I reviewed the following data within the [place] History obtained from family if available, nursing notes, old chart and ekg, as well as notes from prior ED visits. Patient presented for right shoulder pain after a fall, we will assess with imaging as indicated at this time.   Procedures  Rachel Davidson was evaluated in Emergency Department on 09/16/2019 for the symptoms described in the history of  present illness. She was evaluated in the context of the global COVID-19 pandemic, which necessitated consideration that the patient might be at risk for infection with the SARS-CoV-2 virus that causes COVID-19. Institutional protocols and algorithms that pertain to the evaluation of patients at risk for COVID-19 are in a state of rapid change based on information released by regulatory bodies including the CDC and federal and state organizations. These policies and algorithms were followed during the patient's care in the ED.  ____________________________________________   RADIOLOGY Images were viewed by me  Right shoulder x-rays Are unremarkable ____________________________________________   DIFFERENTIAL DIAGNOSIS   Contusion, fracture, dislocation, AC separation, rotator cuff injury  FINAL ASSESSMENT AND PLAN  Fall, right shoulder pain   Plan: The patient had presented for fall with resulting right shoulder pain. Patient's imaging did not reveal any acute process.  She is placed in a shoulder sling, encouraged to let it rest for a day and then gentle range of motion exercises, NSAIDs for pain.   Laurence Aly, MD    Note: This note was generated in part or whole with voice recognition software. Voice recognition is usually quite accurate but there are transcription errors that can and very often do occur. I apologize for any typographical errors that were not detected and corrected.     Earleen Newport, MD 09/16/19 1020

## 2019-09-16 NOTE — ED Triage Notes (Signed)
Pt states she was working and tripped over an oxygen cord and fell injuring her right shoulder.

## 2019-12-29 ENCOUNTER — Other Ambulatory Visit: Payer: Self-pay | Admitting: Family Medicine

## 2019-12-29 DIAGNOSIS — I10 Essential (primary) hypertension: Secondary | ICD-10-CM

## 2019-12-29 MED ORDER — TRIAMTERENE-HCTZ 37.5-25 MG PO TABS: 1.0000 | ORAL_TABLET | Freq: Every day | ORAL | 0 refills | Status: DC

## 2019-12-29 NOTE — Telephone Encounter (Signed)
Medication Refill - Medication: triamterene-hydrochlorothiazide (MAXZIDE-25) 37.5-25 MG tablet   Has the patient contacted their pharmacy?yes (Agent: If no, request that the patient contact the pharmacy for the refill.) (Agent: If yes, when and what did the pharmacy advise?)Contact PCP  Preferred Pharmacy (with phone number or street name):  [facility] [place] [place] - [facility] [address] Phone:  [phone]  Fax:  [phone]       Agent: Please be advised that RX refills may take up to 3 business days. We ask that you follow-up with your pharmacy.

## 2020-01-12 ENCOUNTER — Encounter: Payer: Self-pay | Admitting: Family Medicine

## 2020-01-12 ENCOUNTER — Other Ambulatory Visit: Payer: Self-pay

## 2020-01-12 ENCOUNTER — Ambulatory Visit (INDEPENDENT_AMBULATORY_CARE_PROVIDER_SITE_OTHER): Payer: No Typology Code available for payment source | Admitting: Family Medicine

## 2020-01-12 ENCOUNTER — Other Ambulatory Visit: Payer: Self-pay | Admitting: Family Medicine

## 2020-01-12 VITALS — BP 124/80 | HR 73 | Temp 98.4°F | Resp 18 | Ht 62.0 in | Wt 207.7 lb

## 2020-01-12 DIAGNOSIS — E785 Hyperlipidemia, unspecified: Secondary | ICD-10-CM | POA: Diagnosis not present

## 2020-01-12 DIAGNOSIS — E538 Deficiency of other specified B group vitamins: Secondary | ICD-10-CM

## 2020-01-12 DIAGNOSIS — E8881 Metabolic syndrome: Secondary | ICD-10-CM

## 2020-01-12 DIAGNOSIS — Z114 Encounter for screening for human immunodeficiency virus [HIV]: Secondary | ICD-10-CM

## 2020-01-12 DIAGNOSIS — I10 Essential (primary) hypertension: Secondary | ICD-10-CM

## 2020-01-12 DIAGNOSIS — G629 Polyneuropathy, unspecified: Secondary | ICD-10-CM

## 2020-01-12 DIAGNOSIS — Z0000 Encounter for general adult medical examination without abnormal findings: Secondary | ICD-10-CM | POA: Diagnosis not present

## 2020-01-12 DIAGNOSIS — Z1231 Encounter for screening mammogram for malignant neoplasm of breast: Secondary | ICD-10-CM

## 2020-01-12 MED ORDER — TRIAMTERENE-HCTZ 37.5-25 MG PO TABS: 1.0000 | ORAL_TABLET | Freq: Every day | ORAL | 1 refills | Status: DC

## 2020-01-12 NOTE — Progress Notes (Signed)
Name: Rachel Davidson   MRN: [id number]    DOB: June 28, 1960   Date:01/12/2020       Progress Note  Subjective  Chief Complaint  Chief Complaint  Patient presents with  . Annual Exam    HPI  Patient presents for annual CPE and follow up  HTN:BP is at goal todayShe denies chestpain,palpitationorSOB. Mild lower extremity edema at the end of her shift, but that is unchanged Continue medication  Metabolic Syndrome: last HCWC3J was stable at  6.2%, we will recheck today . She denies polyphagia, polydipsia or polyuria, discussed a diabetic diet and to consider a carbohydrate restrictive diet   B12 deficiency: states very seldom has burning on her feet, taking otc supplementation States neuropathy has improved, we will recheck labs   Morbid Obesity: Explained that she has dyslipidemia, HTN, pre-diabetes, BMI above 37 with co-morbidities, she states she likes to eat   Dyslipidemia: no need for statins at this time, but discussed a healthy life style  The 10-year ASCVD risk score Rachel Davidson., et al., 2013) is: 4.6%   Values used to calculate the score:     Age: 59 years     Sex: Female     Is Non-Hispanic African American: No     Diabetic: No     Tobacco smoker: No     Systolic Blood Pressure: 628 mmHg     Is BP treated: Yes     HDL Cholesterol: 49 mg/dL     Total Cholesterol: 234 mg/dL    Diet: discussed healthy diet  Exercise: needs to increase physical activity   USPSTF grade A and B recommendations    Office Visit from 01/12/2020 in [facility]  AUDIT-C Score 0     Depression: Phq 9 is  negative Depression screen [facility] 2/9 01/12/2020 06/20/2019 06/17/2019 02/21/2019 01/01/2019  Decreased Interest 0 0 0 0 0  Down, Depressed, Hopeless 0 0 0 0 0  PHQ - 2 Score 0 0 0 0 0  Altered sleeping 0 0 0 0 0  Tired, decreased energy 0 0 0 0 0  Change in appetite 0 0 0 0 0  Feeling bad or failure about yourself  0 0 0 0 0  Trouble concentrating 0 0 0  0 0  Moving slowly or fidgety/restless 0 0 0 0 0  Suicidal thoughts 0 0 0 0 0  PHQ-9 Score 0 0 0 0 0  Difficult doing work/chores - - - Not difficult at all Not difficult at all   Hypertension: BP Readings from Last 3 Encounters:  01/12/20 124/80  09/16/19 (!) 149/82  06/20/19 120/90   Obesity: Wt Readings from Last 3 Encounters:  01/12/20 207 lb 11.2 oz (94.2 kg)  09/16/19 200 lb (90.7 kg)  06/20/19 202 lb 4.8 oz (91.8 kg)   BMI Readings from Last 3 Encounters:  01/12/20 37.99 kg/m  09/16/19 36.58 kg/m  06/20/19 37.00 kg/m     Hep C Screening: up to date STD testing and prevention (HIV/chl/gon/syphilis): just HIV today because no records, no need for others Intimate partner violence: negative screen  Sexual History (Partners/Practices/Protection from [employer] hx STI/Pregnancy Plans): sexually active, one partner, no pain Menstrual History/LMP/Abnormal Bleeding: discussed post-menopausal bleeding  Incontinence Symptoms: no problems   Breast cancer:  - Last Mammogram: 04/2019  - BRCA gene screening: N/A  Osteoporosis: Discussed high calcium and vitamin D supplementation, weight bearing exercises  Cervical cancer screening: up to date , goes to gyn   Skin  cancer: Discussed monitoring for atypical lesions  Colorectal cancer: repeat in 11/2020  Lung cancer:   Low Dose CT Chest recommended if Age 32-80 years, 30 pack-year currently smoking OR have quit w/in 15years. Patient does not qualify.   ECG: 2019   Advanced Care Planning: A voluntary discussion about advance care planning including the explanation and discussion of advance directives.  Discussed health care proxy and Living will, and the patient was able to identify a health care proxy as Rachel Davidson - husband   Patient does not have a living will at present time.  Lipids: Lab Results  Component Value Date   CHOL 234 (H) 01/01/2019   CHOL 199 01/21/2018   CHOL 240 (H) 12/09/2015   Lab Results  Component  Value Date   HDL 49 (L) 01/01/2019   HDL 49 (L) 01/21/2018   HDL 59 12/09/2015   Lab Results  Component Value Date   LDLCALC 144 (H) 01/01/2019   LDLCALC 109 (H) 01/21/2018   LDLCALC 161 (H) 12/09/2015   Lab Results  Component Value Date   TRIG 268 (H) 01/01/2019   TRIG 286 (H) 01/21/2018   TRIG 102 12/09/2015   Lab Results  Component Value Date   CHOLHDL 4.8 01/01/2019   CHOLHDL 4.1 01/21/2018   CHOLHDL 4.1 12/09/2015   No results found for: LDLDIRECT  Glucose: Glucose  Date Value Ref Range Status  03/17/2013 126 (H) 65 - 99 mg/dL Final   Glucose, Bld  Date Value Ref Range Status  01/01/2019 91 65 - 99 mg/dL Final    Comment:    .            Fasting reference interval .   01/21/2018 107 65 - 139 mg/dL Final    Comment:    .        Non-fasting reference interval .   12/09/2015 107 (H) 65 - 99 mg/dL Final    Patient Active Problem List   Diagnosis Date Noted  . B12 deficiency 01/21/2018  . Essential hypertension 05/10/2015  . Dysmetabolic syndrome 69/67/8938  . Neuropathy 10/28/2014  . Obesity (BMI 30-39.9) 10/28/2014  . Symptomatic menopausal or female climacteric states 10/28/2014  . Fibrocystic breast 11/04/2012    Past Surgical History:  Procedure Laterality Date  . BREAST CYST ASPIRATION Left 1990s and 2012   Dr Jamal Collin  . BREAST SURGERY Left 04/2002   biopsy-benign  . CESAREAN SECTION  1996  . CESAREAN SECTION and Bilateral Tubal Ligation  1999  . COLONOSCOPY  12/16/2010   Dr. Elliott-hyperplastic polyp  . DILATION AND CURETTAGE OF UTERUS  1995/ 1985   blighted ovum-CAK and incomplete AB-Dr CAK  . ENDOMETRIAL BIOPSY  03/2009   proliferative  . TONSILLECTOMY  1979    Family History  Problem Relation Age of Onset  . Cancer Father        lung  . Diabetes Father   . Heart disease Father   . Hypertension Father   . Hypertension Brother   . Prostate cancer Maternal Grandfather   . Hypertension Sister   . Breast cancer Neg Hx      Social History   Socioeconomic History  . Marital status: Married    Spouse name: Not on file  . Number of children: 2  . Years of education: Not on file  . Highest education level: Associate degree: academic program  Occupational History  . Occupation: RN-surgical floor at [employer]  Tobacco Use  . Smoking status: Never Smoker  . Smokeless  tobacco: Never Used  Vaping Use  . Vaping Use: Never used  Substance and Sexual Activity  . Alcohol use: No    Alcohol/week: 0.0 standard drinks  . Drug use: No  . Sexual activity: Yes    Partners: Male    Birth control/protection: Post-menopausal  Other Topics Concern  . 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 Difficulty of Paying Living Expenses: Not hard at all  Food Insecurity: No Food Insecurity  . Worried About Charity fundraiser in the Last Year: Never true  . Ran Out of Food in the Last Year: Never true  Transportation Needs: No Transportation Needs  . Lack of Transportation (Medical): No  . Lack of Transportation (Non-Medical): No  Physical Activity: Insufficiently Active  . Days of Exercise per Week: 3 days  . Minutes of Exercise per Session: 20 min  Stress: No Stress Concern Present  . Feeling of Stress : Not at all  Social Connections: Socially Integrated  . Frequency of Communication with Friends and Family: More than three times a week  . Frequency of Social Gatherings with Friends and Family: More than three times a week  . Attends Religious Services: More than 4 times per year  . Active Member of Clubs or Organizations: Yes  . Attends Archivist Meetings: More than 4 times per year  . Marital Status: Married  Human resources officer Violence: Not At Risk  . Fear of Current or Ex-Partner: No  . Emotionally Abused: No  . Physically  Abused: No  . Sexually Abused: No     Current Outpatient Medications:  .  aspirin 81 MG chewable tablet, Chew 81 mg by mouth daily., Disp: , Rfl:  .  DiphenhydrAMINE HCl (BENADRYL ALLERGY PO), Take 1 tablet by mouth as needed. , Disp: , Rfl:  .  ibuprofen (ADVIL,MOTRIN) 200 MG tablet, Take by mouth., Disp: , Rfl:  .  triamterene-hydrochlorothiazide (MAXZIDE-25) 37.5-25 MG tablet, Take 1 tablet by mouth daily., Disp: 90 tablet, Rfl: 1  No Known Allergies   ROS  Constitutional: Negative for fever or weight change.  Respiratory: Negative for cough and shortness of breath.   Cardiovascular: Negative for chest pain or palpitations.  Gastrointestinal: Negative for abdominal pain, no bowel changes.  Musculoskeletal: Negative for gait problem or joint swelling.  Skin: Negative for rash.  Neurological: Negative for dizziness or headache.  No other specific complaints in a complete review of systems (except as listed in HPI above).  Objective  Vitals:   01/12/20 0843  BP: 124/80  Pulse: 73  Resp: 18  Temp: 98.4 F (36.9 C)  TempSrc: Oral  SpO2: 98%  Weight: 207 lb 11.2 oz (94.2 kg)  Height: 5' 2"  (1.575 m)    Body mass index is 37.99 kg/m.  Physical Exam  Constitutional: Patient appears well-developed and well-nourished. No distress.  HENT: Head: Normocephalic and atraumatic. Ears: B TMs ok, no erythema or effusion; Nose: Nose normal. Mouth/Throat: not done Eyes: Conjunctivae and EOM are normal. Pupils are equal, round, and reactive to light. No scleral icterus.  Neck: Normal range of motion. Neck supple. No JVD present. No thyromegaly present.  Cardiovascular: Normal rate, regular rhythm and normal heart sounds.  No murmur heard. No BLE edema. Pulmonary/Chest: Effort normal and breath sounds normal.  No respiratory distress. Abdominal: Soft. Bowel sounds are normal, no distension. There is no tenderness. no masses Breast: no lumps or masses, no nipple discharge or  rashes FEMALE GENITALIA:  Not done, sees gyn in Dec RECTAL: not done  Musculoskeletal: Normal range of motion, no joint effusions. No gross deformities Neurological: he is alert and oriented to person, place, and time. No cranial nerve deficit. Coordination, balance, strength, speech and gait are normal.  Skin: Skin is warm and dry. No rash noted. No erythema.  Psychiatric: Patient has a normal mood and affect. behavior is normal. Judgment and thought content normal.  Fall Risk: Fall Risk  01/12/2020 06/20/2019 06/17/2019 02/21/2019 01/01/2019  Falls in the past year? 1 0 0 1 0  Number falls in past yr: 1 0 0 0 0  Injury with Fall? 0 0 0 1 0  Risk for fall due to : History of fall(s) - - - -     Functional Status Survey: Is the patient deaf or have difficulty hearing?: No Does the patient have difficulty seeing, even when wearing glasses/contacts?: No Does the patient have difficulty concentrating, remembering, or making decisions?: No Does the patient have difficulty walking or climbing stairs?: No Does the patient have difficulty dressing or bathing?: No Does the patient have difficulty doing errands alone such as visiting a doctor's office or shopping?: No   Assessment & Plan  1. Dyslipidemia  - Lipid panel  2. B12 deficiency  - B12 and Folate Panel  3. Essential hypertension  - COMPLETE METABOLIC PANEL WITH GFR - CBC with Differential/Platelet  4. Morbid obesity ([place])  BMI above 35 with co-morbidities   5. Encounter for screening mammogram for malignant neoplasm of breast   6. Dysmetabolic syndrome  - Hemoglobin A1c - MM 3D SCREEN BREAST BILATERAL; Future  7. Neuropathy  - B12 and Folate Panel  8. Encounter for screening for HIV  - HIV Antibody (routine testing w rflx)  -USPSTF grade A and B recommendations reviewed with patient; age-appropriate recommendations, preventive care, screening tests, etc discussed and encouraged; healthy living encouraged; see  AVS for patient education given to patient -Discussed importance of 150 minutes of physical activity weekly, eat two servings of fish weekly, eat one serving of tree nuts ( cashews, pistachios, pecans, almonds.Marland Kitchen) every other day, eat 6 servings of fruit/vegetables daily and drink plenty of water and avoid sweet beverages.

## 2020-01-13 LAB — COMPLETE METABOLIC PANEL WITH GFR
AG Ratio: 1.9 (calc) (ref 1.0–2.5)
ALT: 29 U/L (ref 6–29)
AST: 18 U/L (ref 10–35)
Albumin: 4.2 g/dL (ref 3.6–5.1)
Alkaline phosphatase (APISO): 88 U/L (ref 37–153)
BUN: 14 mg/dL (ref 7–25)
CO2: 31 mmol/L (ref 20–32)
Calcium: 9.5 mg/dL (ref 8.6–10.4)
Chloride: 100 mmol/L (ref 98–110)
Creat: 0.75 mg/dL (ref 0.50–1.05)
GFR, Est African American: 101 mL/min/{1.73_m2} (ref >=60)
GFR, Est Non African American: 87 mL/min/{1.73_m2} (ref >=60)
Globulin: 2.2 g/dL (calc) (ref 1.9–3.7)
Glucose, Bld: 115 mg/dL — ABNORMAL HIGH (ref 65–99)
Potassium: 3.7 mmol/L (ref 3.5–5.3)
Sodium: 139 mmol/L (ref 135–146)
Total Bilirubin: 1.3 mg/dL — ABNORMAL HIGH (ref 0.2–1.2)
Total Protein: 6.4 g/dL (ref 6.1–8.1)

## 2020-01-13 LAB — CBC WITH DIFFERENTIAL/PLATELET
Absolute Monocytes: 736 cells/uL (ref 200–950)
Basophils Absolute: 72 cells/uL (ref 0–200)
Basophils Relative: 0.9 %
Eosinophils Absolute: 144 cells/uL (ref 15–500)
Eosinophils Relative: 1.8 %
HCT: 45.8 % — ABNORMAL HIGH (ref 35.0–45.0)
Hemoglobin: 15.3 g/dL (ref 11.7–15.5)
Lymphs Abs: 2480 cells/uL (ref 850–3900)
MCH: 29 pg (ref 27.0–33.0)
MCHC: 33.4 g/dL (ref 32.0–36.0)
MCV: 86.7 fL (ref 80.0–100.0)
MPV: 11.8 fL (ref 7.5–12.5)
Monocytes Relative: 9.2 %
Neutro Abs: 4568 cells/uL (ref 1500–7800)
Neutrophils Relative %: 57.1 %
Platelets: 229 10*3/uL (ref 140–400)
RBC: 5.28 10*6/uL — ABNORMAL HIGH (ref 3.80–5.10)
RDW: 13.2 % (ref 11.0–15.0)
Total Lymphocyte: 31 %
WBC: 8 10*3/uL (ref 3.8–10.8)

## 2020-01-13 LAB — LIPID PANEL
Cholesterol: 201 mg/dL — ABNORMAL HIGH (ref <200)
HDL: 53 mg/dL (ref >=50)
LDL Cholesterol (Calc): 119 mg/dL (calc) — ABNORMAL HIGH
Non-HDL Cholesterol (Calc): 148 mg/dL (calc) — ABNORMAL HIGH (ref <130)
Total CHOL/HDL Ratio: 3.8 (calc) (ref <5.0)
Triglycerides: 174 mg/dL — ABNORMAL HIGH (ref <150)

## 2020-01-13 LAB — HEMOGLOBIN A1C
Hgb A1c MFr Bld: 6.2 % of total Hgb — ABNORMAL HIGH (ref <5.7)
Mean Plasma Glucose: 131 (calc)
eAG (mmol/L): 7.3 (calc)

## 2020-01-13 LAB — HIV ANTIBODY (ROUTINE TESTING W REFLEX): HIV 1&2 Ab, 4th Generation: NONREACTIVE

## 2020-01-13 LAB — B12 AND FOLATE PANEL
Folate: 10.3 ng/mL
Vitamin B-12: 560 pg/mL (ref 200–1100)

## 2020-05-06 ENCOUNTER — Ambulatory Visit (INDEPENDENT_AMBULATORY_CARE_PROVIDER_SITE_OTHER): Payer: No Typology Code available for payment source | Admitting: Advanced Practice Midwife

## 2020-05-06 ENCOUNTER — Encounter: Payer: Self-pay | Admitting: Advanced Practice Midwife

## 2020-05-06 ENCOUNTER — Other Ambulatory Visit: Payer: Self-pay

## 2020-05-06 VITALS — BP 110/80 | Ht 62.0 in | Wt 200.0 lb

## 2020-05-06 DIAGNOSIS — Z0000 Encounter for general adult medical examination without abnormal findings: Secondary | ICD-10-CM

## 2020-05-06 NOTE — Patient Instructions (Signed)
Health Maintenance, Female Adopting a healthy lifestyle and getting preventive care are important in promoting health and wellness. Ask your health care provider about:  The right schedule for you to have regular tests and exams.  Things you can do on your own to prevent diseases and keep yourself healthy. What should I know about diet, weight, and exercise? Eat a healthy diet   Eat a diet that includes plenty of vegetables, fruits, low-fat dairy products, and lean protein.  Do not eat a lot of foods that are high in solid fats, added sugars, or sodium. Maintain a healthy weight Body mass index (BMI) is used to identify weight problems. It estimates body fat based on height and weight. Your health care provider can help determine your BMI and help you achieve or maintain a healthy weight. Get regular exercise Get regular exercise. This is one of the most important things you can do for your health. Most adults should:  Exercise for at least 150 minutes each week. The exercise should increase your heart rate and make you sweat (moderate-intensity exercise).  Do strengthening exercises at least twice a week. This is in addition to the moderate-intensity exercise.  Spend less time sitting. Even light physical activity can be beneficial. Watch cholesterol and blood lipids Have your blood tested for lipids and cholesterol at 60 years of age, then have this test every 5 years. Have your cholesterol levels checked more often if:  Your lipid or cholesterol levels are high.  You are older than 60 years of age.  You are at high risk for heart disease. What should I know about cancer screening? Depending on your health history and family history, you may need to have cancer screening at various ages. This may include screening for:  Breast cancer.  Cervical cancer.  Colorectal cancer.  Skin cancer.  Lung cancer. What should I know about heart disease, diabetes, and high blood  pressure? Blood pressure and heart disease  High blood pressure causes heart disease and increases the risk of stroke. This is more likely to develop in people who have high blood pressure readings, are of African descent, or are overweight.  Have your blood pressure checked: ? Every 3-5 years if you are 18-39 years of age. ? Every year if you are 40 years old or older. Diabetes Have regular diabetes screenings. This checks your fasting blood sugar level. Have the screening done:  Once every three years after age 40 if you are at a normal weight and have a low risk for diabetes.  More often and at a younger age if you are overweight or have a high risk for diabetes. What should I know about preventing infection? Hepatitis B If you have a higher risk for hepatitis B, you should be screened for this virus. Talk with your health care provider to find out if you are at risk for hepatitis B infection. Hepatitis C Testing is recommended for:  Everyone born from 1945 through 1965.  Anyone with known risk factors for hepatitis C. Sexually transmitted infections (STIs)  Get screened for STIs, including gonorrhea and chlamydia, if: ? You are sexually active and are younger than 60 years of age. ? You are older than 60 years of age and your health care provider tells you that you are at risk for this type of infection. ? Your sexual activity has changed since you were last screened, and you are at increased risk for chlamydia or gonorrhea. Ask your health care provider if   you are at risk.  Ask your health care provider about whether you are at high risk for HIV. Your health care provider may recommend a prescription medicine to help prevent HIV infection. If you choose to take medicine to prevent HIV, you should first get tested for HIV. You should then be tested every 3 months for as long as you are taking the medicine. Pregnancy  If you are about to stop having your period (premenopausal) and  you may become pregnant, seek counseling before you get pregnant.  Take 400 to 800 micrograms (mcg) of folic acid every day if you become pregnant.  Ask for birth control (contraception) if you want to prevent pregnancy. Osteoporosis and menopause Osteoporosis is a disease in which the bones lose minerals and strength with aging. This can result in bone fractures. If you are 65 years old or older, or if you are at risk for osteoporosis and fractures, ask your health care provider if you should:  Be screened for bone loss.  Take a calcium or vitamin D supplement to lower your risk of fractures.  Be given hormone replacement therapy (HRT) to treat symptoms of menopause. Follow these instructions at home: Lifestyle  Do not use any products that contain nicotine or tobacco, such as cigarettes, e-cigarettes, and chewing tobacco. If you need help quitting, ask your health care provider.  Do not use street drugs.  Do not share needles.  Ask your health care provider for help if you need support or information about quitting drugs. Alcohol use  Do not drink alcohol if: ? Your health care provider tells you not to drink. ? You are pregnant, may be pregnant, or are planning to become pregnant.  If you drink alcohol: ? Limit how much you use to 0-1 drink a day. ? Limit intake if you are breastfeeding.  Be aware of how much alcohol is in your drink. In the [place] one drink equals one 12 oz bottle of beer (355 mL), one 5 oz glass of wine (148 mL), or one 1 oz glass of hard liquor (44 mL). General instructions  Schedule regular health, dental, and eye exams.  Stay current with your vaccines.  Tell your health care provider if: ? You often feel depressed. ? You have ever been abused or do not feel safe at home. Summary  Adopting a healthy lifestyle and getting preventive care are important in promoting health and wellness.  Follow your health care provider's instructions about healthy  diet, exercising, and getting tested or screened for diseases.  Follow your health care provider's instructions on monitoring your cholesterol and blood pressure. This information is not intended to replace advice given to you by your health care provider. Make sure you discuss any questions you have with your health care provider. Document Revised: 05/08/2018 Document Reviewed: 05/08/2018 Elsevier Patient Education  2020 [employer]  

## 2020-05-07 NOTE — Progress Notes (Signed)
Gynecology Annual Exam  Date of Visit: 05/06/2020  PCP: Alba Cory, MD  Chief Complaint:  Chief Complaint  Patient presents with  . Gynecologic Exam    History of Present Illness:Patient is a 59 y.o. [id number] presents for annual exam. The patient has no gyn complaints today.   LMP: Patient's last menstrual period was 07/12/2011. Postcoital Bleeding: no   The patient is sexually active. She denies dyspareunia.  The patient does perform self breast exams.  There is no notable family history of breast or ovarian cancer in her family.  The patient wears seatbelts: yes.   The patient has regular exercise: she walks daily. She tries to eat a healthy diet although, admits increased calorie intake. She admits adequate hydration and sleep.    The patient denies current symptoms of depression.     Review of Systems: Review of Systems  Constitutional: Negative for chills and fever.  HENT: Negative for congestion, ear discharge, ear pain, hearing loss, sinus pain and sore throat.   Eyes: Negative for blurred vision and double vision.  Respiratory: Negative for cough, shortness of breath and wheezing.   Cardiovascular: Negative for chest pain, palpitations and leg swelling.  Gastrointestinal: Negative for abdominal pain, blood in stool, constipation, diarrhea, heartburn, melena, nausea and vomiting.  Genitourinary: Negative for dysuria, flank pain, frequency, hematuria and urgency.  Musculoskeletal: Negative for back pain, joint pain and myalgias.  Skin: Negative for itching and rash.  Neurological: Negative for dizziness, tingling, tremors, sensory change, speech change, focal weakness, seizures, loss of consciousness, weakness and headaches.  Endo/Heme/Allergies: Negative for environmental allergies. Does not bruise/bleed easily.  Psychiatric/Behavioral: Negative for depression, hallucinations, memory loss, substance abuse and suicidal ideas. The patient is not nervous/anxious and  does not have insomnia.     Past Medical History:  Patient Active Problem List   Diagnosis Date Noted  . B12 deficiency 01/21/2018  . Essential hypertension 05/10/2015  . Dysmetabolic syndrome 10/28/2014  . Neuropathy 10/28/2014  . Obesity (BMI 30-39.9) 10/28/2014  . Symptomatic menopausal or female climacteric states 10/28/2014  . Fibrocystic breast 11/04/2012    Past Surgical History:  Past Surgical History:  Procedure Laterality Date  . BREAST CYST ASPIRATION Left 1990s and 2012   Dr Evette Cristal  . BREAST SURGERY Left 04/2002   biopsy-benign  . CESAREAN SECTION  1996  . CESAREAN SECTION and Bilateral Tubal Ligation  1999  . COLONOSCOPY  12/16/2010   Dr. Elliott-hyperplastic polyp  . DILATION AND CURETTAGE OF UTERUS  1995/ 1985   blighted ovum-CAK and incomplete AB-Dr CAK  . ENDOMETRIAL BIOPSY  03/2009   proliferative  . TONSILLECTOMY  1979    Gynecologic History:  Patient's last menstrual period was 07/12/2011. Last Pap: 1 year ago Results were:  no abnormalities  Last mammogram: 1 year ago Results were: BI-RAD I  Obstetric History: [id number]  Family History:  Family History  Problem Relation Age of Onset  . Cancer Father        lung  . Diabetes Father   . Heart disease Father   . Hypertension Father   . Hypertension Brother   . Prostate cancer Maternal Grandfather   . Hypertension Sister   . Breast cancer Neg Hx     Social History:  Social History   Socioeconomic History  . Marital status: Married    Spouse name: Not on file  . Number of children: 2  . Years of education: Not on file  . Highest education level: Associate degree:  academic program  Occupational History  . Occupation: RN-surgical floor at [employer]  Tobacco Use  . Smoking status: Never Smoker  . Smokeless tobacco: Never Used  Vaping Use  . Vaping Use: Never used  Substance and Sexual Activity  . Alcohol use: No    Alcohol/week: 0.0 standard drinks  . Drug use: No  . Sexual activity: Yes     Partners: Male    Birth control/protection: Post-menopausal  Other Topics Concern  . 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 Difficulty of Paying Living Expenses: Not hard at all  Food Insecurity: No Food Insecurity  . Worried About Programme researcher, broadcasting/film/video in the Last Year: Never true  . Ran Out of Food in the Last Year: Never true  Transportation Needs: No Transportation Needs  . Lack of Transportation (Medical): No  . Lack of Transportation (Non-Medical): No  Physical Activity: Insufficiently Active  . Days of Exercise per Week: 3 days  . Minutes of Exercise per Session: 20 min  Stress: No Stress Concern Present  . Feeling of Stress : Not at all  Social Connections: Socially Integrated  . Frequency of Communication with Friends and Family: More than three times a week  . Frequency of Social Gatherings with Friends and Family: More than three times a week  . Attends Religious Services: More than 4 times per year  . Active Member of Clubs or Organizations: Yes  . Attends Banker Meetings: More than 4 times per year  . Marital Status: Married  Catering manager Violence: Not At Risk  . Fear of Current or Ex-Partner: No  . Emotionally Abused: No  . Physically Abused: No  . Sexually Abused: No    Allergies:  No Known Allergies  Medications: Prior to Admission medications   Medication Sig Start Date End Date Taking? Authorizing Provider  aspirin 81 MG chewable tablet Chew 81 mg by mouth daily.   Yes [provider]  DiphenhydrAMINE HCl (BENADRYL ALLERGY PO) Take 1 tablet by mouth as needed.    Yes [provider]  ibuprofen (ADVIL,MOTRIN) 200 MG tablet Take by mouth.   Yes [provider]  triamterene-hydrochlorothiazide (MAXZIDE-25) 37.5-25 MG tablet Take 1 tablet  by mouth daily. 01/12/20  Yes Alba Cory, MD    Physical Exam Vitals: Blood pressure 110/80, height 5\' 2"  (1.575 m), weight 200 lb (90.7 kg), last menstrual period 07/12/2011.  General: NAD HEENT: normocephalic, anicteric Thyroid: no enlargement, no palpable nodules Pulmonary: No increased work of breathing, CTAB Cardiovascular: RRR, distal pulses 2+ Breast: Breast symmetrical, no tenderness, no palpable nodules or masses, no skin or nipple retraction present, no nipple discharge.  No axillary or supraclavicular lymphadenopathy. Abdomen: NABS, soft, non-tender, non-distended.  Umbilicus without lesions.  No hepatomegaly, splenomegaly or masses palpable. No evidence of hernia  Genitourinary: deferred for no concerns/PAP interval Extremities: no edema, erythema, or tenderness Neurologic: Grossly intact Psychiatric: mood appropriate, affect full    Assessment: 59 y.o. 66 routine annual exam  Plan: Problem List Items Addressed This Visit   None   Visit Diagnoses    Well woman exam without gynecological exam    -  Primary      1) Mammogram - recommend yearly screening mammogram.  Mammogram was ordered by PCP in August of  this year  2) STI screening  was offered and declined  3) ASCCP guidelines and rationale discussed.  Patient opts for every 5 years screening interval  4) Osteoporosis  - per USPTF routine screening DEXA at age 86  Consider FDA-approved medical therapies in postmenopausal women and men aged 61 years and older, based on the following: a) A hip or vertebral (clinical or morphometric) fracture b) T-score ? -2.5 at the femoral neck or spine after appropriate evaluation to exclude secondary causes C) Low bone mass (T-score between -1.0 and -2.5 at the femoral neck or spine) and a 10-year probability of a hip fracture ? 3% or a 10-year probability of a major osteoporosis-related fracture ? 20% based on the [place]-adapted WHO algorithm   5) Routine healthcare  maintenance including cholesterol, diabetes screening discussed managed by PCP  6) Colonoscopy is due next year and will be ordered by PCP.  Screening recommended starting at age 49 for average risk individuals, age 59 for individuals deemed at increased risk (including African Americans) and recommended to continue until age 35.  For patient age 107-85 individualized approach is recommended.  Gold standard screening is via colonoscopy, Cologuard screening is an acceptable alternative for patient unwilling or unable to undergo colonoscopy.  "Colorectal cancer screening for average?risk adults: 2018 guideline update from the American Cancer Society"CA: A Cancer Journal for Clinicians: Oct 25, 2016   7) Return in about 1 year (around 05/06/2021) for annual established gyn.    Parke Poisson, CNM [facility] 05/07/20, 11:28 AM

## 2020-05-24 ENCOUNTER — Ambulatory Visit
Admission: RE | Admit: 2020-05-24 | Discharge: 2020-05-24 | Disposition: A | Discharge status: Home / Self Care | Payer: No Typology Code available for payment source | Source: Ambulatory Visit | Attending: Family Medicine | Admitting: Family Medicine

## 2020-05-24 ENCOUNTER — Other Ambulatory Visit: Payer: Self-pay

## 2020-05-24 DIAGNOSIS — E8881 Metabolic syndrome: Secondary | ICD-10-CM | POA: Insufficient documentation

## 2020-05-24 DIAGNOSIS — Z1231 Encounter for screening mammogram for malignant neoplasm of breast: Secondary | ICD-10-CM | POA: Insufficient documentation

## 2020-07-16 NOTE — Progress Notes (Signed)
Name: Rachel Davidson   MRN: [id number]    DOB: 04/08/1961   Date:07/19/2020       Progress Note  Subjective  Chief Complaint  Follow up  HPI  HTN:BP is at goal todayShe denies chestpain,palpitation, orSOB. Lower extremity edema has resolved   Metabolic Syndrome:  hgbA1C has been stable at 6.2 %. She denies polyphagia, polydipsia or polyuria, discussed a diabetic diet and to consider a carbohydrate restrictive diet with her again   B12 deficiency: states very seldom has burning on her feet, taking otc supplementation States neuropathy has improved, last levels were back to normal range   Morbid Obesity: Explained that she has dyslipidemia, HTN, pre-diabetes, BMI above 35 with co-morbidities, she states she likes to eat . She has gained 9 lbs since last visit, she states usually gains weight during Winter and Holidays . Discussed weight loss goal and she states she would like to go down below 200 lbs   Dyslipidemia: no need for statins at this time. Continue life style modifications   The 10-year ASCVD risk score Denman George DC Jr., et al., 2013) is: 4.1%   Values used to calculate the score:     Age: 60 years     Sex: Female     Is Non-Hispanic African American: No     Diabetic: No     Tobacco smoker: No     Systolic Blood Pressure: 122 mmHg     Is BP treated: Yes     HDL Cholesterol: 53 mg/dL     Total Cholesterol: 201 mg/dL  Patient Active Problem List   Diagnosis Date Noted  . B12 deficiency 01/21/2018  . Essential hypertension 05/10/2015  . Dysmetabolic syndrome 10/28/2014  . Neuropathy 10/28/2014  . Obesity (BMI 30-39.9) 10/28/2014  . Symptomatic menopausal or female climacteric states 10/28/2014  . Fibrocystic breast 11/04/2012    Past Surgical History:  Procedure Laterality Date  . BREAST CYST ASPIRATION Left 1990s and 2012   Dr Evette Cristal  . BREAST SURGERY Left 04/2002   biopsy-benign  . CESAREAN SECTION  1996  . CESAREAN SECTION and Bilateral Tubal  Ligation  1999  . COLONOSCOPY  12/16/2010   Dr. Elliott-hyperplastic polyp  . DILATION AND CURETTAGE OF UTERUS  1995/ 1985   blighted ovum-CAK and incomplete AB-Dr CAK  . ENDOMETRIAL BIOPSY  03/2009   proliferative  . TONSILLECTOMY  1979    Family History  Problem Relation Age of Onset  . Cancer Father        lung  . Diabetes Father   . Heart disease Father   . Hypertension Father   . Hypertension Brother   . Prostate cancer Maternal Grandfather   . Hypertension Sister   . Breast cancer Neg Hx     Social History   Tobacco Use  . Smoking status: Never Smoker  . Smokeless tobacco: Never Used  Substance Use Topics  . Alcohol use: No    Alcohol/week: 0.0 standard drinks     Current Outpatient Medications:  .  aspirin 81 MG chewable tablet, Chew 81 mg by mouth as needed., Disp: , Rfl:  .  DiphenhydrAMINE HCl (BENADRYL ALLERGY PO), Take 1 tablet by mouth 2 (two) times daily., Disp: , Rfl:  .  ibuprofen (ADVIL,MOTRIN) 200 MG tablet, Take by mouth as needed., Disp: , Rfl:  .  triamterene-hydrochlorothiazide (MAXZIDE-25) 37.5-25 MG tablet, Take 1 tablet by mouth daily., Disp: 90 tablet, Rfl: 1  No Known Allergies  I personally reviewed active problem list, medication  list, allergies, family history, social history, health maintenance, notes from last encounter with the patient/caregiver today.   ROS  Constitutional: Negative for fever or weight change.  Respiratory: Negative for cough and shortness of breath.   Cardiovascular: Negative for chest pain or palpitations.  Gastrointestinal: Negative for abdominal pain, no bowel changes.  Musculoskeletal: Negative for gait problem or joint swelling.  Skin: Negative for rash.  Neurological: Negative for dizziness or headache.  No other specific complaints in a complete review of systems (except as listed in HPI above).  Objective  Vitals:   07/19/20 0856  BP: 122/74  Pulse: 87  Resp: 16  Temp: 97.8 F (36.6 C)   TempSrc: Oral  SpO2: 99%  Weight: 216 lb (98 kg)  Height: 5\' 2"  (1.575 m)    Body mass index is 39.51 kg/m.  Physical Exam  Constitutional: Patient appears well-developed and well-nourished. Obese No distress.  HEENT: head atraumatic, normocephalic, pupils equal and reactive to light, neck supple Cardiovascular: Normal rate, regular rhythm and normal heart sounds.  No murmur heard. No BLE edema. Pulmonary/Chest: Effort normal and breath sounds normal. No respiratory distress. Abdominal: Soft.  There is no tenderness. Psychiatric: Patient has a normal mood and affect. behavior is normal. Judgment and thought content normal.  PHQ2/9: Depression screen [facility] 2/9 07/19/2020 01/12/2020 06/20/2019 06/17/2019 02/21/2019  Decreased Interest 0 0 0 0 0  Down, Depressed, Hopeless 0 0 0 0 0  PHQ - 2 Score 0 0 0 0 0  Altered sleeping - 0 0 0 0  Tired, decreased energy - 0 0 0 0  Change in appetite - 0 0 0 0  Feeling bad or failure about yourself  - 0 0 0 0  Trouble concentrating - 0 0 0 0  Moving slowly or fidgety/restless - 0 0 0 0  Suicidal thoughts - 0 0 0 0  PHQ-9 Score - 0 0 0 0  Difficult doing work/chores - - - - Not difficult at all    phq 9 is negative   Fall Risk: Fall Risk  07/19/2020 01/12/2020 06/20/2019 06/17/2019 02/21/2019  Falls in the past year? 1 1 0 0 1  Number falls in past yr: 1 1 0 0 0  Comment 3 - - - -  Injury with Fall? 1 0 0 0 1  Risk for fall due to : - History of fall(s) - - -     Functional Status Survey: Is the patient deaf or have difficulty hearing?: No Does the patient have difficulty seeing, even when wearing glasses/contacts?: No Does the patient have difficulty concentrating, remembering, or making decisions?: No Does the patient have difficulty walking or climbing stairs?: No Does the patient have difficulty dressing or bathing?: No Does the patient have difficulty doing errands alone such as visiting a doctor's office or shopping?:  No    Assessment & Plan  1. Dyslipidemia   2. B12 deficiency  Continue supplementation   3. Essential hypertension  - triamterene-hydrochlorothiazide (MAXZIDE-25) 37.5-25 MG tablet; Take 1 tablet by mouth daily.  Dispense: 90 tablet; Refill: 1  4. Dysmetabolic syndrome  Reviewed diet and discussed portion control   5. Morbid obesity (HCC)  Discussed with the patient the risk posed by an increased BMI. Discussed importance of portion control, calorie counting and at least 150 minutes of physical activity weekly. Avoid sweet beverages and drink more water. Eat at least 6 servings of fruit and vegetables daily   6. Neuropathy  Doing better

## 2020-07-19 ENCOUNTER — Other Ambulatory Visit: Payer: Self-pay | Admitting: Family Medicine

## 2020-07-19 ENCOUNTER — Encounter: Payer: Self-pay | Admitting: Family Medicine

## 2020-07-19 ENCOUNTER — Ambulatory Visit (INDEPENDENT_AMBULATORY_CARE_PROVIDER_SITE_OTHER): Payer: No Typology Code available for payment source | Admitting: Family Medicine

## 2020-07-19 ENCOUNTER — Other Ambulatory Visit: Payer: Self-pay

## 2020-07-19 VITALS — BP 122/74 | HR 87 | Temp 97.8°F | Resp 16 | Ht 62.0 in | Wt 216.0 lb

## 2020-07-19 DIAGNOSIS — G629 Polyneuropathy, unspecified: Secondary | ICD-10-CM

## 2020-07-19 DIAGNOSIS — E785 Hyperlipidemia, unspecified: Secondary | ICD-10-CM | POA: Diagnosis not present

## 2020-07-19 DIAGNOSIS — E8881 Metabolic syndrome: Secondary | ICD-10-CM | POA: Diagnosis not present

## 2020-07-19 DIAGNOSIS — I10 Essential (primary) hypertension: Secondary | ICD-10-CM

## 2020-07-19 DIAGNOSIS — E538 Deficiency of other specified B group vitamins: Secondary | ICD-10-CM

## 2020-07-19 MED ORDER — TRIAMTERENE-HCTZ 37.5-25 MG PO TABS: 1.0000 | ORAL_TABLET | Freq: Every day | ORAL | 1 refills | Status: DC

## 2020-09-24 ENCOUNTER — Other Ambulatory Visit: Payer: Self-pay

## 2020-09-24 MED FILL — Triamterene & Hydrochlorothiazide Tab 37.5-25 MG: ORAL | 90 days supply | Qty: 90 | Fill #0 | Status: AC

## 2020-11-16 ENCOUNTER — Encounter: Payer: Self-pay | Admitting: Family Medicine

## 2020-11-16 ENCOUNTER — Other Ambulatory Visit: Payer: Self-pay

## 2020-11-17 ENCOUNTER — Encounter: Payer: Self-pay | Admitting: Family Medicine

## 2020-11-17 ENCOUNTER — Other Ambulatory Visit: Payer: Self-pay

## 2020-11-17 ENCOUNTER — Telehealth (INDEPENDENT_AMBULATORY_CARE_PROVIDER_SITE_OTHER): Payer: No Typology Code available for payment source | Admitting: Family Medicine

## 2020-11-17 DIAGNOSIS — J069 Acute upper respiratory infection, unspecified: Secondary | ICD-10-CM | POA: Diagnosis not present

## 2020-11-17 DIAGNOSIS — R059 Cough, unspecified: Secondary | ICD-10-CM

## 2020-11-17 MED ORDER — BENZONATATE 100 MG PO CAPS
100.0000 mg | ORAL_CAPSULE | Freq: Three times a day (TID) | ORAL | 0 refills | Status: DC | PRN
  Filled 2020-11-17: qty 60, 20d supply, fill #0

## 2020-11-17 NOTE — Progress Notes (Signed)
Name: Rachel Davidson   MRN: [id number]    DOB: Oct 13, 1960   Date:11/17/2020       Progress Note  Subjective  Chief Complaint  Sinus Infection  I connected with  Rachel Davidson  on 11/17/20 at  9:20 AM EDT by a video enabled telemedicine application and verified that I am speaking with the correct person using two identifiers.  I discussed the limitations of evaluation and management by telemedicine and the availability of in person appointments. The patient expressed understanding and agreed to proceed with the virtual visit  Staff also discussed with the patient that there may be a patient responsible charge related to this service. Patient Location: at school , stain glass class Provider Location: [facility] Additional Individuals present:alone   HPI  URI: symptoms started June 17 th, 2022 initially had nasal congestion, she states that it went down to her chest , she has post-nasal drainage and productive cough. She had a low grade fever 100.1 two days ago. She denies SOB or wheezing. She did not get tested for COVID-19   She had COVID-19 vaccine JJ but no booster. She denies smoking or history of asthma  Patient Active Problem List   Diagnosis Date Noted   B12 deficiency 01/21/2018   Essential hypertension 05/10/2015   Dysmetabolic syndrome 10/28/2014   Neuropathy 10/28/2014   Obesity (BMI 30-39.9) 10/28/2014   Symptomatic menopausal or female climacteric states 10/28/2014   Fibrocystic breast 11/04/2012    Past Surgical History:  Procedure Laterality Date   BREAST CYST ASPIRATION Left 1990s and 2012   Dr Evette Cristal   BREAST SURGERY Left 04/2002   biopsy-benign   CESAREAN SECTION  1996   CESAREAN SECTION and Bilateral Tubal Ligation  1999   COLONOSCOPY  12/16/2010   Dr. Elliott-hyperplastic polyp   DILATION AND CURETTAGE OF UTERUS  1995/ 1985   blighted ovum-CAK and incomplete AB-Dr CAK   ENDOMETRIAL BIOPSY  03/2009   proliferative   TONSILLECTOMY  1979    Family  History  Problem Relation Age of Onset   Cancer Father        lung   Diabetes Father    Heart disease Father    Hypertension Father    Hypertension Brother    Prostate cancer Maternal Grandfather    Hypertension Sister    Breast cancer Neg Hx     Social History   Socioeconomic History   Marital status: Married    Spouse name: Not on file   Number of children: 2   Years of education: Not on file   Highest education level: Associate degree: academic program  Occupational History   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    One  child in college, one has already graduated (2020)    Social Determinants of Corporate investment banker Strain: Low Risk    Difficulty of Paying Living Expenses: Not hard at all  Food Insecurity: No Food Insecurity   Worried About Programme researcher, broadcasting/film/video in the Last Year: Never true  Ran Out of Food in the Last Year: Never true  Transportation Needs: No Transportation Needs   Lack of Transportation (Medical): No   Lack of Transportation (Non-Medical): No  Physical Activity: Insufficiently Active   Days of Exercise per Week: 3 days   Minutes of Exercise per Session: 20 min  Stress: No Stress Concern Present   Feeling of Stress : Not at all  Social Connections: Socially Integrated   Frequency of Communication with Friends and Family: More than three times a week   Frequency of Social Gatherings with Friends and Family: More than three times a week   Attends Religious Services: More than 4 times per year   Active Member of [employer] or Organizations: Yes    Attends Engineer, structural: More than 4 times per year   Marital Status: Married  Catering manager Violence: Not At Risk   Fear of Current or Ex-Partner: No   Emotionally Abused: No   Physically Abused: No   Sexually Abused: No     Current Outpatient Medications:    aspirin 81 MG chewable tablet, Chew 81 mg by mouth as needed., Disp: , Rfl:    DiphenhydrAMINE HCl (BENADRYL ALLERGY PO), Take 1 tablet by mouth 2 (two) times daily., Disp: , Rfl:    ibuprofen (ADVIL,MOTRIN) 200 MG tablet, Take by mouth as needed., Disp: , Rfl:    triamterene-hydrochlorothiazide (MAXZIDE-25) 37.5-25 MG tablet, TAKE 1 TABLET BY MOUTH DAILY, Disp: 90 tablet, Rfl: 1   benzonatate (TESSALON) 100 MG capsule, Take 1 capsule (100 mg total) by mouth 3 (three) times daily as needed for cough., Disp: 60 capsule, Rfl: 0  No Known Allergies  I personally reviewed active problem list, medication list, allergies, family history, social history, health maintenance with the patient/caregiver today.   ROS  Ten systems reviewed and is negative except as mentioned in HPI Objective  Virtual encounter, vitals not obtained.  There is no height or weight on file to calculate BMI.  Physical Exam  Awake, alert and oriented   PHQ2/9: Depression screen [facility] 2/9 11/17/2020 07/19/2020 01/12/2020 06/20/2019 06/17/2019  Decreased Interest 0 0 0 0 0  Down, Depressed, Hopeless 0 0 0 0 0  PHQ - 2 Score 0 0 0 0 0  Altered sleeping - - 0 0 0  Tired, decreased energy - - 0 0 0  Change in appetite - - 0 0 0  Feeling bad or failure about yourself  - - 0 0 0  Trouble concentrating - - 0 0 0  Moving slowly or fidgety/restless - - 0 0 0  Suicidal thoughts - - 0 0 0  PHQ-9 Score - - 0 0 0  Difficult doing work/chores - - - - -   PHQ-2/9 Result is negative.    Fall Risk: Fall Risk  11/17/2020 07/19/2020 01/12/2020 06/20/2019 06/17/2019  Falls in the past year? 1 1 1  0 0  Number falls in past yr: 0 1 1 0 0  Comment - 3 - - -   Injury with Fall? 0 1 0 0 0  Risk for fall due to : - - History of fall(s) - -     Assessment & Plan  1. Viral upper respiratory tract infection  - benzonatate (TESSALON) 100 MG capsule; Take 1 capsule (100 mg total) by mouth 3 (three) times daily as needed for cough.  Dispense: 60 capsule; Refill: 0 - Novel Coronavirus, NAA ([facility])  2. Cough  - benzonatate (TESSALON) 100 MG capsule; Take 1  capsule (100 mg total) by mouth 3 (three) times daily as needed for cough.  Dispense: 60 capsule; Refill: 0 - Novel Coronavirus, NAA ([facility])   I discussed the assessment and treatment plan with the patient. The patient was provided an opportunity to ask questions and all were answered. The patient agreed with the plan and demonstrated an understanding of the instructions.  The patient was advised to call back or seek an in-person evaluation if the symptoms worsen or if the condition fails to improve as anticipated.  I provided 15 minutes of non-face-to-face time during this encounter.

## 2020-11-18 ENCOUNTER — Other Ambulatory Visit: Payer: Self-pay

## 2020-11-18 ENCOUNTER — Encounter: Payer: Self-pay | Admitting: Family Medicine

## 2020-11-18 LAB — NOVEL CORONAVIRUS, NAA: SARS-CoV-2, NAA: NOT DETECTED

## 2020-11-18 LAB — SARS-COV-2, NAA 2 DAY TAT

## 2020-11-18 MED ORDER — AMOXICILLIN 875 MG PO TABS
ORAL_TABLET | ORAL | 0 refills | Status: DC
  Filled 2020-11-18: qty 20, 10d supply, fill #0

## 2020-11-19 ENCOUNTER — Other Ambulatory Visit: Payer: Self-pay

## 2020-12-14 ENCOUNTER — Other Ambulatory Visit: Payer: Self-pay

## 2021-01-12 NOTE — Progress Notes (Signed)
Name: Rachel Davidson   MRN: [id number]    DOB: Jun 13, 1960   Date:01/14/2021       Progress Note  Subjective  Chief Complaint  Follow Up  HPI  HTN: BP is at goal today  She denies chest pain, palpitation,  or SOB. Compliant with medication.    Metabolic Syndrome:  hgbA1C has been stable at 6.2 %. She denies polyphagia, polydipsia or polyuria, discussed a diabetic diet a, suggested Metformin of GLP-1 agonist but she does not want medication  AR: she takes Benadryl twice daily to control drainage, we will try xyzal in the morning , take benadryl prn at night. She states worse in the spring    B12 deficiency: states very seldom has burning on her feet, neuropathic pain on toes resolved with supplementation but currently not taking it anymore. Discussed rechecking level but she prefers holding off    Morbid Obesity: BMI above 35 with co-morbidities such as dyslipidemia, HTN, pre-diabetes she states she likes to eat . She states she is trying to lose weight   Dyslipidemia:  no need for statins at this time. Continue life style modifications   The 10-year ASCVD risk score Denman George DC Jr., et al., 2013) is: 4.1%   Values used to calculate the score:     Age: 60 years     Sex: Female     Is Non-Hispanic African American: No     Diabetic: No     Tobacco smoker: No     Systolic Blood Pressure: 122 mmHg     Is BP treated: Yes     HDL Cholesterol: 53 mg/dL     Total Cholesterol: 201 mg/dL   Patient Active Problem List   Diagnosis Date Noted   B12 deficiency 01/21/2018   Essential hypertension 05/10/2015   Dysmetabolic syndrome 10/28/2014   Neuropathy 10/28/2014   Obesity (BMI 30-39.9) 10/28/2014   Symptomatic menopausal or female climacteric states 10/28/2014   Fibrocystic breast 11/04/2012    Past Surgical History:  Procedure Laterality Date   BREAST CYST ASPIRATION Left 1990s and 2012   Dr Evette Cristal   BREAST SURGERY Left 04/2002   biopsy-benign   CESAREAN SECTION  1996    CESAREAN SECTION and Bilateral Tubal Ligation  1999   COLONOSCOPY  12/16/2010   Dr. Elliott-hyperplastic polyp   DILATION AND CURETTAGE OF UTERUS  1995/ 1985   blighted ovum-CAK and incomplete AB-Dr CAK   ENDOMETRIAL BIOPSY  03/2009   proliferative   TONSILLECTOMY  1979    Family History  Problem Relation Age of Onset   Cancer Father        lung   Diabetes Father    Heart disease Father    Hypertension Father    Hypertension Brother    Prostate cancer Maternal Grandfather    Hypertension Sister    Breast cancer Neg Hx     Social History   Tobacco Use   Smoking status: Never   Smokeless tobacco: Never  Substance Use Topics   Alcohol use: No    Alcohol/week: 0.0 standard drinks     Current Outpatient Medications:    aspirin 81 MG chewable tablet, Chew 81 mg by mouth as needed., Disp: , Rfl:    triamterene-hydrochlorothiazide (MAXZIDE-25) 37.5-25 MG tablet, TAKE 1 TABLET BY MOUTH DAILY, Disp: 90 tablet, Rfl: 1   amoxicillin (AMOXIL) 875 MG tablet, Take 1 tablet by mouth every 12 hours for 10 days (Patient not taking: Reported on 01/14/2021), Disp: 20 tablet, Rfl: 0  benzonatate (TESSALON) 100 MG capsule, Take 1 capsule (100 mg total) by mouth 3 (three) times daily as needed for cough. (Patient not taking: Reported on 01/14/2021), Disp: 60 capsule, Rfl: 0   DiphenhydrAMINE HCl (BENADRYL ALLERGY PO), Take 1 tablet by mouth 2 (two) times daily. (Patient not taking: Reported on 01/14/2021), Disp: , Rfl:    ibuprofen (ADVIL,MOTRIN) 200 MG tablet, Take by mouth as needed. (Patient not taking: Reported on 01/14/2021), Disp: , Rfl:   No Known Allergies  I personally reviewed active problem list, medication list, allergies, family history, social history, health maintenance with the patient/caregiver today.   ROS  Constitutional: Negative for fever or weight change.  Respiratory: Negative for cough and shortness of breath.   Cardiovascular: Negative for chest pain or palpitations.   Gastrointestinal: Negative for abdominal pain, no bowel changes.  Musculoskeletal: Negative for gait problem or joint swelling.  Skin: Negative for rash.  Neurological: Negative for dizziness or headache.  No other specific complaints in a complete review of systems (except as listed in HPI above).   Objective  Vitals:   01/14/21 0819  BP: 122/70  Pulse: 96  Resp: 16  Temp: 98.7 F (37.1 C)  TempSrc: Oral  SpO2: 97%  Weight: 217 lb 1.6 oz (98.5 kg)  Height: 5\' 2"  (1.575 m)    Body mass index is 39.71 kg/m.  Physical Exam  Constitutional: Patient appears well-developed and well-nourished. Obese  No distress.  HEENT: head atraumatic, normocephalic, pupils equal and reactive to light, neck supple Cardiovascular: Normal rate, regular rhythm and normal heart sounds.  No murmur heard. No BLE edema. Pulmonary/Chest: Effort normal and breath sounds normal. No respiratory distress. Abdominal: Soft.  There is no tenderness. Psychiatric: Patient has a normal mood and affect. behavior is normal. Judgment and thought content normal.   Recent Results (from the past 2160 hour(s))  Novel Coronavirus, NAA ([facility])     Status: None   Collection Time: 11/17/20 12:00 AM   Specimen: Nasopharyngeal(NP) swabs in vial transport medium   Nasopharynge  Result Value Ref Range   SARS-CoV-2, NAA Not Detected Not Detected    Comment: This nucleic acid amplification test was developed and its performance characteristics determined by 11/19/20. Nucleic acid amplification tests include RT-PCR and TMA. This test has not been FDA cleared or approved. This test has been authorized by FDA under an Emergency Use Authorization (EUA). This test is only authorized for the duration of time the declaration that circumstances exist justifying the authorization of the emergency use of in vitro diagnostic tests for detection of SARS-CoV-2 virus and/or diagnosis of COVID-19 infection under section  564(b)(1) of the Act, 21 U.S.C. [employer]) (1), unless the authorization is terminated or revoked sooner. When diagnostic testing is negative, the possibility of a false negative result should be considered in the context of a patient's recent exposures and the presence of clinical signs and symptoms consistent with COVID-19. An individual without symptoms of COVID-19 and who is not shedding SARS-CoV-2 virus wo uld expect to have a negative (not detected) result in this assay.   SARS-COV-2, NAA 2 DAY TAT     Status: None   Collection Time: 11/17/20 12:00 AM   Nasopharynge  Result Value Ref Range   SARS-CoV-2, NAA 2 DAY TAT Performed      PHQ2/9: Depression screen [facility] 2/9 01/14/2021 11/17/2020 07/19/2020 01/12/2020 06/20/2019  Decreased Interest 0 0 0 0 0  Down, Depressed, Hopeless 0 0 0 0 0  PHQ - 2 Score  0 0 0 0 0  Altered sleeping - - - 0 0  Tired, decreased energy - - - 0 0  Change in appetite - - - 0 0  Feeling bad or failure about yourself  - - - 0 0  Trouble concentrating - - - 0 0  Moving slowly or fidgety/restless - - - 0 0  Suicidal thoughts - - - 0 0  PHQ-9 Score - - - 0 0  Difficult doing work/chores - - - - -    phq 9 is negative   Fall Risk: Fall Risk  01/14/2021 11/17/2020 07/19/2020 01/12/2020 06/20/2019  Falls in the past year? 0 1 1 1  0  Number falls in past yr: 0 0 1 1 0  Comment - - 3 - -  Injury with Fall? 0 0 1 0 0  Risk for fall due to : - - - History of fall(s) -  Follow up Falls evaluation completed - - - -      Functional Status Survey: Is the patient deaf or have difficulty hearing?: No Does the patient have difficulty seeing, even when wearing glasses/contacts?: No Does the patient have difficulty concentrating, remembering, or making decisions?: No Does the patient have difficulty walking or climbing stairs?: No Does the patient have difficulty dressing or bathing?: No Does the patient have difficulty doing errands alone such as visiting a  doctor's office or shopping?: No    Assessment & Plan  1. Essential hypertension  - CBC with Differential/Platelet - COMPLETE METABOLIC PANEL WITH GFR - triamterene-hydrochlorothiazide (MAXZIDE-25) 37.5-25 MG tablet; TAKE 1 TABLET BY MOUTH DAILY  Dispense: 90 tablet; Refill: 1  2. Dysmetabolic syndrome  - Hemoglobin A1c  3. Dyslipidemia  - Lipid panel  4. Neuropathy   5. B12 deficiency   6. Morbid obesity (HCC)  Discussed with the patient the risk posed by an increased BMI. Discussed importance of portion control, calorie counting and at least 150 minutes of physical activity weekly. Avoid sweet beverages and drink more water. Eat at least 6 servings of fruit and vegetables daily    7. Need for shingles vaccine  - Varicella-zoster vaccine IM  8. Colon cancer screening  - Ambulatory referral to Gastroenterology  9. Perennial allergic rhinitis with seasonal variation  - levocetirizine (XYZAL) 5 MG tablet; Take 1 tablet (5 mg total) by mouth every evening.  Dispense: 90 tablet; Refill: 1

## 2021-01-14 ENCOUNTER — Other Ambulatory Visit: Payer: Self-pay

## 2021-01-14 ENCOUNTER — Encounter: Payer: Self-pay | Admitting: Family Medicine

## 2021-01-14 ENCOUNTER — Ambulatory Visit (INDEPENDENT_AMBULATORY_CARE_PROVIDER_SITE_OTHER): Payer: No Typology Code available for payment source | Admitting: Family Medicine

## 2021-01-14 VITALS — BP 122/70 | HR 96 | Temp 98.7°F | Resp 16 | Ht 62.0 in | Wt 217.1 lb

## 2021-01-14 DIAGNOSIS — J3089 Other allergic rhinitis: Secondary | ICD-10-CM

## 2021-01-14 DIAGNOSIS — G629 Polyneuropathy, unspecified: Secondary | ICD-10-CM | POA: Diagnosis not present

## 2021-01-14 DIAGNOSIS — Z23 Encounter for immunization: Secondary | ICD-10-CM

## 2021-01-14 DIAGNOSIS — E538 Deficiency of other specified B group vitamins: Secondary | ICD-10-CM

## 2021-01-14 DIAGNOSIS — Z1211 Encounter for screening for malignant neoplasm of colon: Secondary | ICD-10-CM

## 2021-01-14 DIAGNOSIS — E8881 Metabolic syndrome: Secondary | ICD-10-CM

## 2021-01-14 DIAGNOSIS — E785 Hyperlipidemia, unspecified: Secondary | ICD-10-CM

## 2021-01-14 DIAGNOSIS — I10 Essential (primary) hypertension: Secondary | ICD-10-CM

## 2021-01-14 DIAGNOSIS — J302 Other seasonal allergic rhinitis: Secondary | ICD-10-CM

## 2021-01-14 MED ORDER — TRIAMTERENE-HCTZ 37.5-25 MG PO TABS
1.0000 | ORAL_TABLET | Freq: Every day | ORAL | 1 refills | Status: DC
  Filled 2021-01-14: qty 90, 90d supply, fill #0
  Filled 2021-05-08: qty 90, 90d supply, fill #1

## 2021-01-14 MED ORDER — LEVOCETIRIZINE DIHYDROCHLORIDE 5 MG PO TABS
5.0000 mg | ORAL_TABLET | Freq: Every evening | ORAL | 1 refills | Status: DC
  Filled 2021-01-14: qty 90, 90d supply, fill #0

## 2021-01-15 LAB — COMPLETE METABOLIC PANEL WITH GFR
AG Ratio: 2 (calc) (ref 1.0–2.5)
ALT: 28 U/L (ref 6–29)
AST: 19 U/L (ref 10–35)
Albumin: 4.3 g/dL (ref 3.6–5.1)
Alkaline phosphatase (APISO): 85 U/L (ref 37–153)
BUN: 22 mg/dL (ref 7–25)
CO2: 32 mmol/L (ref 20–32)
Calcium: 10 mg/dL (ref 8.6–10.4)
Chloride: 101 mmol/L (ref 98–110)
Creat: 0.76 mg/dL (ref 0.50–1.05)
Globulin: 2.1 g/dL (calc) (ref 1.9–3.7)
Glucose, Bld: 100 mg/dL — ABNORMAL HIGH (ref 65–99)
Potassium: 3.8 mmol/L (ref 3.5–5.3)
Sodium: 140 mmol/L (ref 135–146)
Total Bilirubin: 1 mg/dL (ref 0.2–1.2)
Total Protein: 6.4 g/dL (ref 6.1–8.1)
eGFR: 90 mL/min/{1.73_m2} (ref >=60)

## 2021-01-15 LAB — CBC WITH DIFFERENTIAL/PLATELET
Absolute Monocytes: 926 cells/uL (ref 200–950)
Basophils Absolute: 80 cells/uL (ref 0–200)
Basophils Relative: 0.9 %
Eosinophils Absolute: 151 cells/uL (ref 15–500)
Eosinophils Relative: 1.7 %
HCT: 45.3 % — ABNORMAL HIGH (ref 35.0–45.0)
Hemoglobin: 14.7 g/dL (ref 11.7–15.5)
Lymphs Abs: 2777 cells/uL (ref 850–3900)
MCH: 28.8 pg (ref 27.0–33.0)
MCHC: 32.5 g/dL (ref 32.0–36.0)
MCV: 88.8 fL (ref 80.0–100.0)
MPV: 12.1 fL (ref 7.5–12.5)
Monocytes Relative: 10.4 %
Neutro Abs: 4966 cells/uL (ref 1500–7800)
Neutrophils Relative %: 55.8 %
Platelets: 244 10*3/uL (ref 140–400)
RBC: 5.1 10*6/uL (ref 3.80–5.10)
RDW: 13.2 % (ref 11.0–15.0)
Total Lymphocyte: 31.2 %
WBC: 8.9 10*3/uL (ref 3.8–10.8)

## 2021-01-15 LAB — LIPID PANEL
Cholesterol: 212 mg/dL — ABNORMAL HIGH (ref <200)
HDL: 50 mg/dL (ref >=50)
LDL Cholesterol (Calc): 131 mg/dL (calc) — ABNORMAL HIGH
Non-HDL Cholesterol (Calc): 162 mg/dL (calc) — ABNORMAL HIGH (ref <130)
Total CHOL/HDL Ratio: 4.2 (calc) (ref <5.0)
Triglycerides: 168 mg/dL — ABNORMAL HIGH (ref <150)

## 2021-01-15 LAB — HEMOGLOBIN A1C
Hgb A1c MFr Bld: 6.4 % of total Hgb — ABNORMAL HIGH (ref <5.7)
Mean Plasma Glucose: 137 mg/dL
eAG (mmol/L): 7.6 mmol/L

## 2021-01-17 ENCOUNTER — Other Ambulatory Visit: Payer: Self-pay

## 2021-01-21 ENCOUNTER — Other Ambulatory Visit: Payer: Self-pay

## 2021-02-01 ENCOUNTER — Other Ambulatory Visit: Payer: Self-pay

## 2021-02-22 ENCOUNTER — Encounter: Payer: Self-pay | Admitting: Family Medicine

## 2021-02-22 ENCOUNTER — Other Ambulatory Visit: Payer: Self-pay

## 2021-02-22 ENCOUNTER — Ambulatory Visit (INDEPENDENT_AMBULATORY_CARE_PROVIDER_SITE_OTHER): Payer: No Typology Code available for payment source | Admitting: Family Medicine

## 2021-02-22 VITALS — BP 128/80 | HR 97 | Temp 98.1°F | Resp 16 | Ht 62.0 in | Wt 217.0 lb

## 2021-02-22 DIAGNOSIS — Z1231 Encounter for screening mammogram for malignant neoplasm of breast: Secondary | ICD-10-CM

## 2021-02-22 DIAGNOSIS — Z1211 Encounter for screening for malignant neoplasm of colon: Secondary | ICD-10-CM | POA: Diagnosis not present

## 2021-02-22 DIAGNOSIS — Z0000 Encounter for general adult medical examination without abnormal findings: Secondary | ICD-10-CM

## 2021-02-22 NOTE — Patient Instructions (Signed)
Preventive Care 40-61 Years Old, Female Preventive care refers to lifestyle choices and visits with your health care provider that can promote health and wellness. This includes: A yearly physical exam. This is also called an annual wellness visit. Regular dental and eye exams. Immunizations. Screening for certain conditions. Healthy lifestyle choices, such as: Eating a healthy diet. Getting regular exercise. Not using drugs or products that contain nicotine and tobacco. Limiting alcohol use. What can I expect for my preventive care visit? Physical exam Your health care provider will check your: Height and weight. These may be used to calculate your BMI (body mass index). BMI is a measurement that tells if you are at a healthy weight. Heart rate and blood pressure. Body temperature. Skin for abnormal spots. Counseling Your health care provider may ask you questions about your: Past medical problems. Family's medical history. Alcohol, tobacco, and drug use. Emotional well-being. Home life and relationship well-being. Sexual activity. Diet, exercise, and sleep habits. Work and work environment. Access to firearms. Method of birth control. Menstrual cycle. Pregnancy history. What immunizations do I need? Vaccines are usually given at various ages, according to a schedule. Your health care provider will recommend vaccines for you based on your age, medical history, and lifestyle or other factors, such as travel or where you work. What tests do I need? Blood tests Lipid and cholesterol levels. These may be checked every 5 years, or more often if you are over 50 years old. Hepatitis C test. Hepatitis B test. Screening Lung cancer screening. You may have this screening every year starting at age 55 if you have a 30-pack-year history of smoking and currently smoke or have quit within the past 15 years. Colorectal cancer screening. All adults should have this screening starting at  age 50 and continuing until age 75. Your health care provider may recommend screening at age 45 if you are at increased risk. You will have tests every 1-10 years, depending on your results and the type of screening test. Diabetes screening. This is done by checking your blood sugar (glucose) after you have not eaten for a while (fasting). You may have this done every 1-3 years. Mammogram. This may be done every 1-2 years. Talk with your health care provider about when you should start having regular mammograms. This may depend on whether you have a family history of breast cancer. BRCA-related cancer screening. This may be done if you have a family history of breast, ovarian, tubal, or peritoneal cancers. Pelvic exam and Pap test. This may be done every 3 years starting at age 21. Starting at age 30, this may be done every 5 years if you have a Pap test in combination with an HPV test. Other tests STD (sexually transmitted disease) testing, if you are at risk. Bone density scan. This is done to screen for osteoporosis. You may have this scan if you are at high risk for osteoporosis. Talk with your health care provider about your test results, treatment options, and if necessary, the need for more tests. Follow these instructions at home: Eating and drinking  Eat a diet that includes fresh fruits and vegetables, whole grains, lean protein, and low-fat dairy products. Take vitamin and mineral supplements as recommended by your health care provider. Do not drink alcohol if: Your health care provider tells you not to drink. You are pregnant, may be pregnant, or are planning to become pregnant. If you drink alcohol: Limit how much you have to 0-1 drink a day. Be   aware of how much alcohol is in your drink. In the [place] one drink equals one 12 oz bottle of beer (355 mL), one 5 oz glass of wine (148 mL), or one 1 oz glass of hard liquor (44 mL). Lifestyle Take daily care of your teeth and  gums. Brush your teeth every morning and night with fluoride toothpaste. Floss one time each day. Stay active. Exercise for at least 30 minutes 5 or more days each week. Do not use any products that contain nicotine or tobacco, such as cigarettes, e-cigarettes, and chewing tobacco. If you need help quitting, ask your health care provider. Do not use drugs. If you are sexually active, practice safe sex. Use a condom or other form of protection to prevent STIs (sexually transmitted infections). If you do not wish to become pregnant, use a form of birth control. If you plan to become pregnant, see your health care provider for a prepregnancy visit. If told by your health care provider, take low-dose aspirin daily starting at age 63. Find healthy ways to cope with stress, such as: Meditation, yoga, or listening to music. Journaling. Talking to a trusted person. Spending time with friends and family. Safety Always wear your seat belt while driving or riding in a vehicle. Do not drive: If you have been drinking alcohol. Do not ride with someone who has been drinking. When you are tired or distracted. While texting. Wear a helmet and other protective equipment during sports activities. If you have firearms in your house, make sure you follow all gun safety procedures. What's next? Visit your health care provider once a year for an annual wellness visit. Ask your health care provider how often you should have your eyes and teeth checked. Stay up to date on all vaccines. This information is not intended to replace advice given to you by your health care provider. Make sure you discuss any questions you have with your health care provider. Document Revised: 07/23/2020 Document Reviewed: 01/24/2018 Elsevier Patient Education  2022 [employer].

## 2021-02-22 NOTE — Progress Notes (Signed)
Name: Rachel Davidson   MRN: [id number]    DOB: Jun 29, 1960   Date:02/22/2021       Progress Note  Subjective  Chief Complaint  Annual Exam  HPI  Patient presents for annual CPE.  Diet: eating smaller portions Exercise:  needs to exercise 150 minutes per week.   [place] Visit from 01/14/2021 in [facility]  AUDIT-C Score 0      Depression: Phq 9 is  negative Depression screen [facility] 2/9 02/22/2021 01/14/2021 11/17/2020 07/19/2020 01/12/2020  Decreased Interest 0 0 0 0 0  Down, Depressed, Hopeless 0 0 0 0 0  PHQ - 2 Score 0 0 0 0 0  Altered sleeping - - - - 0  Tired, decreased energy - - - - 0  Change in appetite - - - - 0  Feeling bad or failure about yourself  - - - - 0  Trouble concentrating - - - - 0  Moving slowly or fidgety/restless - - - - 0  Suicidal thoughts - - - - 0  PHQ-9 Score - - - - 0  Difficult doing work/chores - - - - -   Hypertension: BP Readings from Last 3 Encounters:  02/22/21 128/80  01/14/21 122/70  07/19/20 122/74   Obesity: Wt Readings from Last 3 Encounters:  02/22/21 217 lb (98.4 kg)  01/14/21 217 lb 1.6 oz (98.5 kg)  07/19/20 216 lb (98 kg)   BMI Readings from Last 3 Encounters:  02/22/21 39.69 kg/m  01/14/21 39.71 kg/m  07/19/20 39.51 kg/m     Vaccines:   Shingrix:up to date  Pneumonia: educated and discussed with patient. Flu: she will get it at work   Hep C Screening: 01/21/18 STD testing and prevention (HIV/chl/gon/syphilis): 01/12/20 Intimate partner violence: negative Sexual History : one partner, no vaginal discharge or pain  Menstrual History/LMP/Abnormal Bleeding: discussed post menopausal bleeding  Incontinence Symptoms: no problems   Breast cancer:  - Last Mammogram: 05/24/20 - BRCA gene screening: N/A  Osteoporosis: Discussed high calcium and vitamin D supplementation, weight bearing exercises  Cervical cancer screening: 05/02/19  Skin cancer: Discussed monitoring for atypical  lesions  Colorectal cancer: 12/19/10  she is due for repeat colonoscopy but refuses to go at this time  Lung cancer: Low Dose CT Chest recommended if Age 1-80 years, 20 pack-year currently smoking OR have quit w/in 15years. Patient does not qualify.   ECG: 01/21/18  Advanced Care Planning: A voluntary discussion about advance care planning including the explanation and discussion of advance directives.  Discussed health care proxy and Living will, and the patient was able to identify a health care proxy as spouse   Lipids: Lab Results  Component Value Date   CHOL 212 (H) 01/14/2021   CHOL 201 (H) 01/12/2020   CHOL 234 (H) 01/01/2019   Lab Results  Component Value Date   HDL 50 01/14/2021   HDL 53 01/12/2020   HDL 49 (L) 01/01/2019   Lab Results  Component Value Date   LDLCALC 131 (H) 01/14/2021   LDLCALC 119 (H) 01/12/2020   LDLCALC 144 (H) 01/01/2019   Lab Results  Component Value Date   TRIG 168 (H) 01/14/2021   TRIG 174 (H) 01/12/2020   TRIG 268 (H) 01/01/2019   Lab Results  Component Value Date   CHOLHDL 4.2 01/14/2021   CHOLHDL 3.8 01/12/2020   CHOLHDL 4.8 01/01/2019   No results found for: LDLDIRECT  Glucose: Glucose  Date Value Ref Range Status  03/17/2013 126 (H) 65 - 99 mg/dL Final   Glucose, Bld  Date Value Ref Range Status  01/14/2021 100 (H) 65 - 99 mg/dL Final    Comment:    .            Fasting reference interval . For someone without known diabetes, a glucose value between 100 and 125 mg/dL is consistent with prediabetes and should be confirmed with a follow-up test. .   01/12/2020 115 (H) 65 - 99 mg/dL Final    Comment:    .            Fasting reference interval . For someone without known diabetes, a glucose value between 100 and 125 mg/dL is consistent with prediabetes and should be confirmed with a follow-up test. .   01/01/2019 91 65 - 99 mg/dL Final    Comment:    .            Fasting reference interval .     Patient  Active Problem List   Diagnosis Date Noted   B12 deficiency 01/21/2018   Essential hypertension 25/09/3974   Dysmetabolic syndrome 73/41/9379   Neuropathy 10/28/2014   Obesity (BMI 30-39.9) 10/28/2014   Symptomatic menopausal or female climacteric states 10/28/2014   Fibrocystic breast 11/04/2012    Past Surgical History:  Procedure Laterality Date   BREAST CYST ASPIRATION Left 1990s and 2012   Dr Jamal Collin   BREAST SURGERY Left 04/2002   biopsy-benign   CESAREAN SECTION  1996   CESAREAN SECTION and Bilateral Tubal Ligation  1999   COLONOSCOPY  12/16/2010   Dr. Elliott-hyperplastic polyp   DILATION AND CURETTAGE OF UTERUS  1995/ 1985   blighted ovum-CAK and incomplete AB-Dr CAK   ENDOMETRIAL BIOPSY  03/2009   proliferative   TONSILLECTOMY  1979    Family History  Problem Relation Age of Onset   Cancer Father        lung   Diabetes Father    Heart disease Father    Hypertension Father    Hypertension Brother    Prostate cancer Maternal Grandfather    Hypertension Sister    Breast cancer Neg Hx     Social History   Socioeconomic History   Marital status: Married    Spouse name: Not on file   Number of children: 2   Years of education: Not on file   Highest education level: Associate degree: academic program  Occupational History   Occupation: RN-surgical floor at [employer]  Tobacco Use   Smoking status: Never   Smokeless tobacco: Never  Vaping Use   Vaping Use: Never used  Substance and Sexual Activity   Alcohol use: No    Alcohol/week: 0.0 standard drinks   Drug use: No   Sexual activity: Yes    Partners: Male    Birth control/protection: Post-menopausal  Other Topics Concern   Not on file  Social History Narrative   She works at [employer]. RN    One  child in college, one has already graduated (2020)    Social Determinants of Radio broadcast assistant Strain: Low Risk    Difficulty of Paying Living Expenses: Not hard at all  Food  Insecurity: No Food Insecurity   Worried About Charity fundraiser in the Last Year: Never true   Arboriculturist in the Last Year: Never true  Transportation Needs: No Transportation Needs   Lack of Transportation (Medical): No   Lack of Transportation (Non-Medical):  No  Physical Activity: Insufficiently Active   Days of Exercise per Week: 3 days   Minutes of Exercise per Session: 30 min  Stress: No Stress Concern Present   Feeling of Stress : Not at all  Social Connections: Socially Integrated   Frequency of Communication with Friends and Family: More than three times a week   Frequency of Social Gatherings with Friends and Family: Once a week   Attends Religious Services: More than 4 times per year   Active Member of [employer] or Organizations: Yes   Attends Music therapist: More than 4 times per year   Marital Status: Married  Human resources officer Violence: Not At Risk   Fear of Current or Ex-Partner: No   Emotionally Abused: No   Physically Abused: No   Sexually Abused: No     Current Outpatient Medications:    levocetirizine (XYZAL) 5 MG tablet, Take 1 tablet (5 mg total) by mouth every evening., Disp: 90 tablet, Rfl: 1   triamterene-hydrochlorothiazide (MAXZIDE-25) 37.5-25 MG tablet, TAKE 1 TABLET BY MOUTH DAILY, Disp: 90 tablet, Rfl: 1  No Known Allergies   ROS  Constitutional: Negative for fever or weight change.  Respiratory: Negative for cough and shortness of breath.   Cardiovascular: Negative for chest pain or palpitations.  Gastrointestinal: Negative for abdominal pain, no bowel changes.  Musculoskeletal: Negative for gait problem or joint swelling.  Skin: Negative for rash.  Neurological: Negative for dizziness or headache.  No other specific complaints in a complete review of systems (except as listed in HPI above).   Objective  Vitals:   02/22/21 1518  BP: 128/80  Pulse: 97  Resp: 16  Temp: 98.1 F (36.7 C)  SpO2: 97%  Weight: 217 lb (98.4  kg)  Height: 5' 2"  (1.575 m)    Body mass index is 39.69 kg/m.  Physical Exam  Constitutional: Patient appears well-developed and well-nourished. No distress.  HENT: Head: Normocephalic and atraumatic. Ears: B TMs ok, no erythema or effusion; Nose: Nose normal. Mouth/Throat: not done  Eyes: Conjunctivae and EOM are normal. Pupils are equal, round, and reactive to light. No scleral icterus.  Neck: Normal range of motion. Neck supple. No JVD present. No thyromegaly present.  Cardiovascular: Normal rate, regular rhythm and normal heart sounds.  No murmur heard. No BLE edema. Pulmonary/Chest: Effort normal and breath sounds normal. No respiratory distress. Abdominal: Soft. Bowel sounds are normal, no distension. There is no tenderness. no masses Breast: no lumps or masses, no nipple discharge or rashes FEMALE GENITALIA:  External genitalia normal Not done  RECTAL: not done  Musculoskeletal: Normal range of motion, no joint effusions. No gross deformities Neurological: he is alert and oriented to person, place, and time. No cranial nerve deficit. Coordination, balance, strength, speech and gait are normal.  Skin: Skin is warm and dry. No rash noted. No erythema.  Psychiatric: Patient has a normal mood and affect. behavior is normal. Judgment and thought content normal.    Recent Results (from the past 2160 hour(s))  Lipid panel     Status: Abnormal   Collection Time: 01/14/21  9:13 AM  Result Value Ref Range   Cholesterol 212 (H) <200 mg/dL   HDL 50 > OR = 50 mg/dL   Triglycerides 168 (H) <150 mg/dL   LDL Cholesterol (Calc) 131 (H) mg/dL (calc)    Comment: Reference range: <100 . Desirable range <100 mg/dL for primary prevention;   <70 mg/dL for patients with CHD or diabetic patients  with >  or = 2 CHD risk factors. Marland Kitchen LDL-C is now calculated using the Martin-Hopkins  calculation, which is a validated novel method providing  better accuracy than the Friedewald equation in the   estimation of LDL-C.  Cresenciano Genre et al. Annamaria Helling. 8413;244(01): 2061-2068  (http://education.[facility].com/faq/FAQ164)    Total CHOL/HDL Ratio 4.2 <5.0 (calc)   Non-HDL Cholesterol (Calc) 162 (H) <130 mg/dL (calc)    Comment: For patients with diabetes plus 1 major ASCVD risk  factor, treating to a non-HDL-C goal of <100 mg/dL  (LDL-C of <70 mg/dL) is considered a therapeutic  option.   CBC with Differential/Platelet     Status: Abnormal   Collection Time: 01/14/21  9:13 AM  Result Value Ref Range   WBC 8.9 3.8 - 10.8 Thousand/uL   RBC 5.10 3.80 - 5.10 Million/uL   Hemoglobin 14.7 11.7 - 15.5 g/dL   HCT 45.3 (H) 35.0 - 45.0 %   MCV 88.8 80.0 - 100.0 fL   MCH 28.8 27.0 - 33.0 pg   MCHC 32.5 32.0 - 36.0 g/dL   RDW 13.2 11.0 - 15.0 %   Platelets 244 140 - 400 Thousand/uL   MPV 12.1 7.5 - 12.5 fL   Neutro Abs 4,966 1,500 - 7,800 cells/uL   Lymphs Abs 2,777 850 - 3,900 cells/uL   Absolute Monocytes 926 200 - 950 cells/uL   Eosinophils Absolute 151 15 - 500 cells/uL   Basophils Absolute 80 0 - 200 cells/uL   Neutrophils Relative % 55.8 %   Total Lymphocyte 31.2 %   Monocytes Relative 10.4 %   Eosinophils Relative 1.7 %   Basophils Relative 0.9 %  COMPLETE METABOLIC PANEL WITH GFR     Status: Abnormal   Collection Time: 01/14/21  9:13 AM  Result Value Ref Range   Glucose, Bld 100 (H) 65 - 99 mg/dL    Comment: .            Fasting reference interval . For someone without known diabetes, a glucose value between 100 and 125 mg/dL is consistent with prediabetes and should be confirmed with a follow-up test. .    BUN 22 7 - 25 mg/dL   Creat 0.76 0.50 - 1.05 mg/dL   eGFR 90 > OR = 60 mL/min/1.33m    Comment: The eGFR is based on the CKD-EPI 2021 equation. To calculate  the new eGFR from a previous Creatinine or Cystatin C result, go to https://www.kidney.org/professionals/ kdoqi/gfr%5Fcalculator    BUN/Creatinine Ratio NOT APPLICABLE 6 - 22 (calc)   Sodium 140 135 -  146 mmol/L   Potassium 3.8 3.5 - 5.3 mmol/L   Chloride 101 98 - 110 mmol/L   CO2 32 20 - 32 mmol/L   Calcium 10.0 8.6 - 10.4 mg/dL   Total Protein 6.4 6.1 - 8.1 g/dL   Albumin 4.3 3.6 - 5.1 g/dL   Globulin 2.1 1.9 - 3.7 g/dL (calc)   AG Ratio 2.0 1.0 - 2.5 (calc)   Total Bilirubin 1.0 0.2 - 1.2 mg/dL   Alkaline phosphatase (APISO) 85 37 - 153 U/L   AST 19 10 - 35 U/L   ALT 28 6 - 29 U/L  Hemoglobin A1c     Status: Abnormal   Collection Time: 01/14/21  9:13 AM  Result Value Ref Range   Hgb A1c MFr Bld 6.4 (H) <5.7 % of total Hgb    Comment: For someone without known diabetes, a hemoglobin  A1c value between 5.7% and 6.4% is consistent with prediabetes and should  be confirmed with a  follow-up test. . For someone with known diabetes, a value <7% indicates that their diabetes is well controlled. A1c targets should be individualized based on duration of diabetes, age, comorbid conditions, and other considerations. . This assay result is consistent with an increased risk of diabetes. . Currently, no consensus exists regarding use of hemoglobin A1c for diagnosis of diabetes for children. .    Mean Plasma Glucose 137 mg/dL   eAG (mmol/L) 7.6 mmol/L     Fall Risk: Fall Risk  02/22/2021 01/14/2021 11/17/2020 07/19/2020 01/12/2020  Falls in the past year? 0 0 1 1 1   Number falls in past yr: 0 0 0 1 1  Comment - - - 3 -  Injury with Fall? 0 0 0 1 0  Risk for fall due to : No Fall Risks - - - History of fall(s)  Follow up Falls prevention discussed Falls evaluation completed - - -     Functional Status Survey: Is the patient deaf or have difficulty hearing?: No Does the patient have difficulty seeing, even when wearing glasses/contacts?: No Does the patient have difficulty concentrating, remembering, or making decisions?: No Does the patient have difficulty walking or climbing stairs?: No Does the patient have difficulty dressing or bathing?: No Does the patient have  difficulty doing errands alone such as visiting a doctor's office or shopping?: No   Assessment & Plan  1. Well adult exam   2. Breast cancer screening by mammogram  - MM Digital Screening; Future  3. Colon cancer screening  Refused    -USPSTF grade A and B recommendations reviewed with patient; age-appropriate recommendations, preventive care, screening tests, etc discussed and encouraged; healthy living encouraged; see AVS for patient education given to patient -Discussed importance of 150 minutes of physical activity weekly, eat two servings of fish weekly, eat one serving of tree nuts ( cashews, pistachios, pecans, almonds.Marland Kitchen) every other day, eat 6 servings of fruit/vegetables daily and drink plenty of water and avoid sweet beverages.

## 2021-05-09 ENCOUNTER — Other Ambulatory Visit: Payer: Self-pay

## 2021-06-29 ENCOUNTER — Encounter: Payer: Self-pay | Admitting: Family Medicine

## 2021-06-29 ENCOUNTER — Ambulatory Visit (INDEPENDENT_AMBULATORY_CARE_PROVIDER_SITE_OTHER): Payer: No Typology Code available for payment source | Admitting: Family Medicine

## 2021-06-29 ENCOUNTER — Other Ambulatory Visit: Payer: Self-pay

## 2021-06-29 ENCOUNTER — Ambulatory Visit: Payer: No Typology Code available for payment source | Admitting: Family Medicine

## 2021-06-29 VITALS — BP 121/75 | HR 61 | Temp 98.2°F | Ht 62.0 in | Wt 210.0 lb

## 2021-06-29 DIAGNOSIS — I10 Essential (primary) hypertension: Secondary | ICD-10-CM

## 2021-06-29 DIAGNOSIS — Z1211 Encounter for screening for malignant neoplasm of colon: Secondary | ICD-10-CM | POA: Diagnosis not present

## 2021-06-29 DIAGNOSIS — E782 Mixed hyperlipidemia: Secondary | ICD-10-CM

## 2021-06-29 DIAGNOSIS — Z1231 Encounter for screening mammogram for malignant neoplasm of breast: Secondary | ICD-10-CM

## 2021-06-29 DIAGNOSIS — R7301 Impaired fasting glucose: Secondary | ICD-10-CM

## 2021-06-29 LAB — MICROALBUMIN, URINE WAIVED
Creatinine, Urine Waived: 50 mg/dL (ref 10–300)
Microalb, Ur Waived: 10 mg/L (ref 0–19)
Microalb/Creat Ratio: <30 mg/g (ref <30)

## 2021-06-29 LAB — BAYER DCA HB A1C WAIVED: HB A1C (BAYER DCA - WAIVED): 6.3 % — ABNORMAL HIGH (ref 4.8–5.6)

## 2021-06-29 MED ORDER — TRIAMTERENE-HCTZ 37.5-25 MG PO TABS
1.0000 | ORAL_TABLET | Freq: Every day | ORAL | 1 refills | Status: DC
  Filled 2021-06-29 – 2021-09-02 (×2): qty 90, 90d supply, fill #0

## 2021-06-29 NOTE — Patient Instructions (Signed)
Please call to schedule your mammogram: °[facility]  °Address: [address], [place], [place] [address]  °Phone: [phone] ° °

## 2021-06-29 NOTE — Progress Notes (Signed)
BP 121/75    Pulse 61    Temp 98.2 F (36.8 C)    Ht 5' 2" (1.575 m)    Wt 210 lb (95.3 kg)    LMP 07/12/2011 Comment: Pt states she only weighs at PCP.   SpO2 100%    BMI 38.41 kg/m    Subjective:    Patient ID: Rachel Davidson, female    DOB: [birth year], 62 y.o.   MRN: [id number]  HPI: Rachel Davidson is a 61 y.o. female who presents today to establish care. She had been seeing Dr. Ancil Boozer over at [facility] and has been seen pretty regularly. She had her physical with labs done in August 2022.  Chief Complaint  Patient presents with   Hypertension   Establish Care   HYPERTENSION / HYPERLIPIDEMIA Satisfied with current treatment? yes Duration of hypertension: chronic BP monitoring frequency: not checking BP medication side effects: no Past BP meds: triamterene-HCTZ Duration of hyperlipidemia: chronic Cholesterol medication side effects: not on anything Cholesterol supplements: none Past cholesterol medications: none Medication compliance: excellent compliance Aspirin: no Recent stressors: no Recurrent headaches: no Visual changes: no Palpitations: no Dyspnea: no Chest pain: no Lower extremity edema: no Dizzy/lightheaded: no  Impaired Fasting Glucose HbA1C:  Lab Results  Component Value Date   HGBA1C 6.4 (H) 01/14/2021   Duration of elevated blood sugar: chronic Polydipsia: no Polyuria: no Weight change: yes- down 7lbs Visual disturbance: no Glucose Monitoring: no Diabetic Education: Not Completed Family history of diabetes: no  Active Ambulatory Problems    Diagnosis Date Noted   Fibrocystic breast 11/04/2012   Neuropathy 10/28/2014   Obesity (BMI 30-39.9) 10/28/2014   Symptomatic menopausal or female climacteric states 10/28/2014   Essential hypertension 05/10/2015   B12 deficiency 01/21/2018   IFG (impaired fasting glucose) 06/29/2021   Mixed hyperlipidemia 06/29/2021   Resolved Ambulatory Problems    Diagnosis Date Noted   Perianal abscess  06/30/2013   BP (high blood pressure) 28/31/5176   Dysmetabolic syndrome 16/11/3708   Past Medical History:  Diagnosis Date   Diffuse cystic mastopathy 2012   Hypertension    Obesity, unspecified    Special screening for malignant neoplasms, colon 2013   Past Surgical History:  Procedure Laterality Date   BREAST CYST ASPIRATION Left 1990s and 2012   Dr Jamal Collin   BREAST SURGERY Left 04/2002   biopsy-benign   CESAREAN SECTION  1996   CESAREAN SECTION and Bilateral Tubal Ligation  1999   COLONOSCOPY  12/16/2010   Dr. Elliott-hyperplastic polyp   DILATION AND CURETTAGE OF UTERUS  1995/ 1985   blighted ovum-CAK and incomplete AB-Dr CAK   ENDOMETRIAL BIOPSY  03/2009   proliferative   TONSILLECTOMY  1979   Outpatient Encounter Medications as of 06/29/2021  Medication Sig   diphenhydrAMINE (BENADRYL ALLERGY) 25 MG tablet Take 25 mg by mouth every 6 (six) hours as needed.   levocetirizine (XYZAL) 5 MG tablet Take 1 tablet (5 mg total) by mouth every evening.   [DISCONTINUED] triamterene-hydrochlorothiazide (MAXZIDE-25) 37.5-25 MG tablet TAKE 1 TABLET BY MOUTH DAILY   triamterene-hydrochlorothiazide (MAXZIDE-25) 37.5-25 MG tablet TAKE 1 TABLET BY MOUTH DAILY   No facility-administered encounter medications on file as of 06/29/2021.   No Known Allergies  Social History   Socioeconomic History   Marital status: Married    Spouse name: Not on file   Number of children: 2   Years of education: Not on file   Highest education level: Associate degree: academic program  Occupational  History   Occupation: RN-surgical floor at [employer]  Tobacco Use   Smoking status: Never   Smokeless tobacco: Never  Vaping Use   Vaping Use: Never used  Substance and Sexual Activity   Alcohol use: No    Alcohol/week: 0.0 standard drinks   Drug use: No   Sexual activity: Yes    Partners: Male    Birth control/protection: Post-menopausal  Other Topics Concern   Not on file  Social History Narrative    She works at [employer]. RN    One  child in college, one has already graduated (2020)    Social Determinants of Radio broadcast assistant Strain: Low Risk    Difficulty of Paying Living Expenses: Not hard at all  Food Insecurity: No Food Insecurity   Worried About Charity fundraiser in the Last Year: Never true   Arboriculturist in the Last Year: Never true  Transportation Needs: No Transportation Needs   Lack of Transportation (Medical): No   Lack of Transportation (Non-Medical): No  Physical Activity: Insufficiently Active   Days of Exercise per Week: 3 days   Minutes of Exercise per Session: 30 min  Stress: No Stress Concern Present   Feeling of Stress : Not at all  Social Connections: Socially Integrated   Frequency of Communication with Friends and Family: More than three times a week   Frequency of Social Gatherings with Friends and Family: Once a week   Attends Religious Services: More than 4 times per year   Active Member of [employer] or Organizations: Yes   Attends Music therapist: More than 4 times per year   Marital Status: Married   Family History  Problem Relation Age of Onset   Cancer Father        lung   Diabetes Father    Heart disease Father    Hypertension Father    Hypertension Brother    Prostate cancer Maternal Grandfather    Hypertension Sister    Breast cancer Neg Hx     Review of Systems  Constitutional: Negative.   Respiratory: Negative.    Cardiovascular: Negative.   Gastrointestinal: Negative.   Musculoskeletal: Negative.   Psychiatric/Behavioral: Negative.     Per HPI unless specifically indicated above     Objective:    BP 121/75    Pulse 61    Temp 98.2 F (36.8 C)    Ht 5' 2" (1.575 m)    Wt 210 lb (95.3 kg)    LMP 07/12/2011 Comment: Pt states she only weighs at PCP.   SpO2 100%    BMI 38.41 kg/m   Wt Readings from Last 3 Encounters:  06/29/21 210 lb (95.3 kg)  02/22/21 217 lb (98.4 kg)  01/14/21 217  lb 1.6 oz (98.5 kg)    Physical Exam Vitals and nursing note reviewed.  Constitutional:      General: She is not in acute distress.    Appearance: Normal appearance. She is not ill-appearing, toxic-appearing or diaphoretic.  HENT:     Head: Normocephalic and atraumatic.     Right Ear: External ear normal.     Left Ear: External ear normal.     Nose: Nose normal.     Mouth/Throat:     Mouth: Mucous membranes are moist.     Pharynx: Oropharynx is clear.  Eyes:     General: No scleral icterus.       Right eye: No discharge.  Left eye: No discharge.  °   Extraocular Movements: Extraocular movements intact.  °   Conjunctiva/sclera: Conjunctivae normal.  °   Pupils: Pupils are equal, round, and reactive to light.  °Cardiovascular:  °   Rate and Rhythm: Normal rate and regular rhythm.  °   Pulses: Normal pulses.  °   Heart sounds: Normal heart sounds. No murmur heard. °  No friction rub. No gallop.  °Pulmonary:  °   Effort: Pulmonary effort is normal. No respiratory distress.  °   Breath sounds: Normal breath sounds. No stridor. No wheezing, rhonchi or rales.  °Chest:  °   Chest wall: No tenderness.  °Musculoskeletal:     °   General: Normal range of motion.  °   Cervical back: Normal range of motion and neck supple.  °Skin: °   General: Skin is warm and dry.  °   Capillary Refill: Capillary refill takes less than 2 seconds.  °   Coloration: Skin is not jaundiced or pale.  °   Findings: No bruising, erythema, lesion or rash.  °Neurological:  °   General: No focal deficit present.  °   Mental Status: She is alert and oriented to person, place, and time. Mental status is at baseline.  °Psychiatric:     °   Mood and Affect: Mood normal.     °   Behavior: Behavior normal.     °   Thought Content: Thought content normal.     °   Judgment: Judgment normal.  ° ° °Results for orders placed or performed in visit on 01/14/21  °Lipid panel  °Result Value Ref Range  ° Cholesterol 212 (H) <200 mg/dL  ° HDL 50 >  OR = 50 mg/dL  ° Triglycerides 168 (H) <150 mg/dL  ° LDL Cholesterol (Calc) 131 (H) mg/dL (calc)  ° Total CHOL/HDL Ratio 4.2 <5.0 (calc)  ° Non-HDL Cholesterol (Calc) 162 (H) <130 mg/dL (calc)  °CBC with Differential/Platelet  °Result Value Ref Range  ° WBC 8.9 3.8 - 10.8 Thousand/uL  ° RBC 5.10 3.80 - 5.10 Million/uL  ° Hemoglobin 14.7 11.7 - 15.5 g/dL  ° HCT 45.3 (H) 35.0 - 45.0 %  ° MCV 88.8 80.0 - 100.0 fL  ° MCH 28.8 27.0 - 33.0 pg  ° MCHC 32.5 32.0 - 36.0 g/dL  ° RDW 13.2 11.0 - 15.0 %  ° Platelets 244 140 - 400 Thousand/uL  ° MPV 12.1 7.5 - 12.5 fL  ° Neutro Abs 4,966 1,500 - 7,800 cells/uL  ° Lymphs Abs 2,777 850 - 3,900 cells/uL  ° Absolute Monocytes 926 200 - 950 cells/uL  ° Eosinophils Absolute 151 15 - 500 cells/uL  ° Basophils Absolute 80 0 - 200 cells/uL  ° Neutrophils Relative % 55.8 %  ° Total Lymphocyte 31.2 %  ° Monocytes Relative 10.4 %  ° Eosinophils Relative 1.7 %  ° Basophils Relative 0.9 %  °COMPLETE METABOLIC PANEL WITH GFR  °Result Value Ref Range  ° Glucose, Bld 100 (H) 65 - 99 mg/dL  ° BUN 22 7 - 25 mg/dL  ° Creat 0.76 0.50 - 1.05 mg/dL  ° eGFR 90 > OR = 60 mL/min/1.73m2  ° BUN/Creatinine Ratio NOT APPLICABLE 6 - 22 (calc)  ° Sodium 140 135 - 146 mmol/L  ° Potassium 3.8 3.5 - 5.3 mmol/L  ° Chloride 101 98 - 110 mmol/L  ° CO2 32 20 - 32 mmol/L  ° Calcium 10.0 8.6 - 10.4 mg/dL  °   Total Protein 6.4 6.1 - 8.1 g/dL  ° Albumin 4.3 3.6 - 5.1 g/dL  ° Globulin 2.1 1.9 - 3.7 g/dL (calc)  ° AG Ratio 2.0 1.0 - 2.5 (calc)  ° Total Bilirubin 1.0 0.2 - 1.2 mg/dL  ° Alkaline phosphatase (APISO) 85 37 - 153 U/L  ° AST 19 10 - 35 U/L  ° ALT 28 6 - 29 U/L  °Hemoglobin A1c  °Result Value Ref Range  ° Hgb A1c MFr Bld 6.4 (H) <5.7 % of total Hgb  ° Mean Plasma Glucose 137 mg/dL  ° eAG (mmol/L) 7.6 mmol/L  ° °   °Assessment & Plan:  ° °Problem List Items Addressed This Visit   ° °  ° Cardiovascular and Mediastinum  ° Essential hypertension - Primary  °  Under good control on current regimen. Continue current  regimen. Continue to monitor. Call with any concerns. Refills given. Labs drawn today. ° °  °  ° Relevant Medications  ° triamterene-hydrochlorothiazide (MAXZIDE-25) 37.5-25 MG tablet  ° Other Relevant Orders  ° Comprehensive metabolic panel  ° Microalbumin, Urine Waived  °  ° Endocrine  ° IFG (impaired fasting glucose)  °  A1c 6.4 on last check. Will recheck today. Continue diet and exercise. Congratulated patient on 7lb weight loss! Continue to monitor.  °  °  ° Relevant Orders  ° Comprehensive metabolic panel  ° Bayer DCA Hb A1c Waived  °  ° Other  ° Mixed hyperlipidemia  °  Rechecking labs today. Await results. Treat as needed.  °  °  ° Relevant Medications  ° triamterene-hydrochlorothiazide (MAXZIDE-25) 37.5-25 MG tablet  ° Other Relevant Orders  ° Comprehensive metabolic panel  ° Lipid Panel w/o Chol/HDL Ratio  ° °Other Visit Diagnoses   ° ° Screening for colon cancer      ° Would like to do cologuard. Ordered today.  ° Relevant Orders  ° Cologuard  ° Encounter for screening mammogram for malignant neoplasm of breast      ° Mammo ordered today. Await results.   ° Relevant Orders  ° MM 3D SCREEN BREAST BILATERAL  ° °  °  ° °Follow up plan: °Return after 9/27 for physical. ° ° ° ° ° °

## 2021-06-29 NOTE — Assessment & Plan Note (Signed)
A1c 6.4 on last check. Will recheck today. Continue diet and exercise. Congratulated patient on 7lb weight loss! Continue to monitor.

## 2021-06-29 NOTE — Assessment & Plan Note (Signed)
Rechecking labs today. Await results. Treat as needed.  °

## 2021-06-29 NOTE — Assessment & Plan Note (Signed)
Under good control on current regimen. Continue current regimen. Continue to monitor. Call with any concerns. Refills given. Labs drawn today.   

## 2021-06-30 LAB — COMPREHENSIVE METABOLIC PANEL
ALT: 23 IU/L (ref 0–32)
AST: 16 IU/L (ref 0–40)
Albumin/Globulin Ratio: 2.3 — ABNORMAL HIGH (ref 1.2–2.2)
Albumin: 4.5 g/dL (ref 3.8–4.8)
Alkaline Phosphatase: 94 IU/L (ref 44–121)
BUN/Creatinine Ratio: 19 (ref 12–28)
BUN: 14 mg/dL (ref 8–27)
Bilirubin Total: 1 mg/dL (ref 0.0–1.2)
CO2: 27 mmol/L (ref 20–29)
Calcium: 9.6 mg/dL (ref 8.7–10.3)
Chloride: 100 mmol/L (ref 96–106)
Creatinine, Ser: 0.75 mg/dL (ref 0.57–1.00)
Globulin, Total: 2 g/dL (ref 1.5–4.5)
Glucose: 123 mg/dL — ABNORMAL HIGH (ref 70–99)
Potassium: 4.1 mmol/L (ref 3.5–5.2)
Sodium: 140 mmol/L (ref 134–144)
Total Protein: 6.5 g/dL (ref 6.0–8.5)
eGFR: 91 mL/min/{1.73_m2} (ref >59)

## 2021-06-30 LAB — LIPID PANEL W/O CHOL/HDL RATIO
Cholesterol, Total: 223 mg/dL — ABNORMAL HIGH (ref 100–199)
HDL: 54 mg/dL (ref >39)
LDL Chol Calc (NIH): 143 mg/dL — ABNORMAL HIGH (ref 0–99)
Triglycerides: 146 mg/dL (ref 0–149)
VLDL Cholesterol Cal: 26 mg/dL (ref 5–40)

## 2021-07-07 ENCOUNTER — Other Ambulatory Visit: Payer: Self-pay

## 2021-07-07 ENCOUNTER — Ambulatory Visit
Admission: RE | Admit: 2021-07-07 | Discharge: 2021-07-07 | Disposition: A | Discharge status: Home / Self Care | Payer: No Typology Code available for payment source | Source: Ambulatory Visit | Attending: Family Medicine | Admitting: Family Medicine

## 2021-07-07 DIAGNOSIS — Z1231 Encounter for screening mammogram for malignant neoplasm of breast: Secondary | ICD-10-CM | POA: Insufficient documentation

## 2021-07-18 LAB — COLOGUARD: COLOGUARD: NEGATIVE

## 2021-07-25 ENCOUNTER — Ambulatory Visit: Payer: No Typology Code available for payment source | Admitting: Family Medicine

## 2021-09-02 ENCOUNTER — Other Ambulatory Visit: Payer: Self-pay

## 2021-10-07 ENCOUNTER — Encounter: Payer: Self-pay | Admitting: Family Medicine

## 2021-10-10 ENCOUNTER — Other Ambulatory Visit: Payer: Self-pay

## 2021-10-10 MED ORDER — SCOPOLAMINE 1 MG/3DAYS TD PT72
1.0000 | MEDICATED_PATCH | TRANSDERMAL | 12 refills | Status: DC
  Filled 2021-10-10: qty 10, 30d supply, fill #0

## 2022-02-23 ENCOUNTER — Encounter: Payer: No Typology Code available for payment source | Admitting: Family Medicine

## 2022-02-24 ENCOUNTER — Encounter: Payer: No Typology Code available for payment source | Admitting: Family Medicine

## 2022-02-27 ENCOUNTER — Encounter: Payer: No Typology Code available for payment source | Admitting: Family Medicine

## 2022-04-05 IMAGING — MG MM DIGITAL SCREENING BILAT W/ TOMO AND CAD
6 of 10 series · 6 of 30 positions shown · non-contrast
Comparison: Previous exam(s).

CLINICAL DATA: Screening.

EXAM:
DIGITAL SCREENING BILATERAL MAMMOGRAM WITH TOMOSYNTHESIS AND CAD
TECHNIQUE: Bilateral screening digital craniocaudal and mediolateral oblique
mammograms were obtained. Bilateral screening digital breast
tomosynthesis was performed. The images were evaluated with
computer-aided detection.

[L CC synth-2D (1 of 2)]
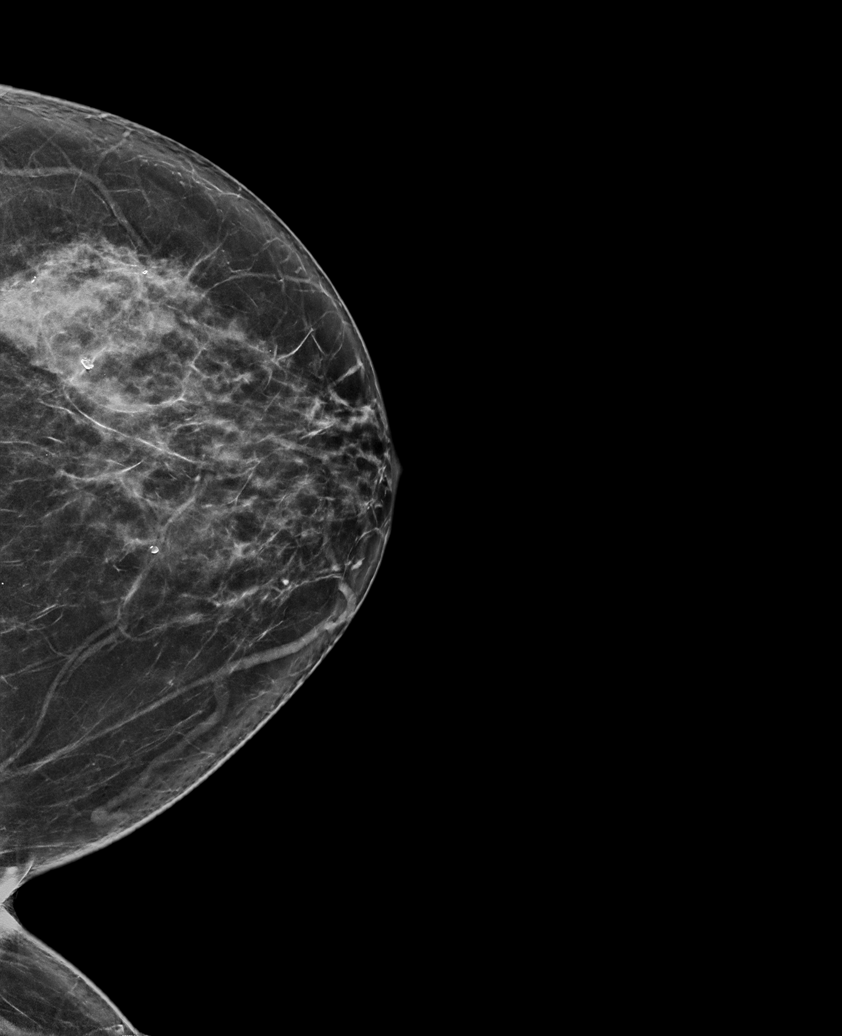

[R MLO synth-2D]
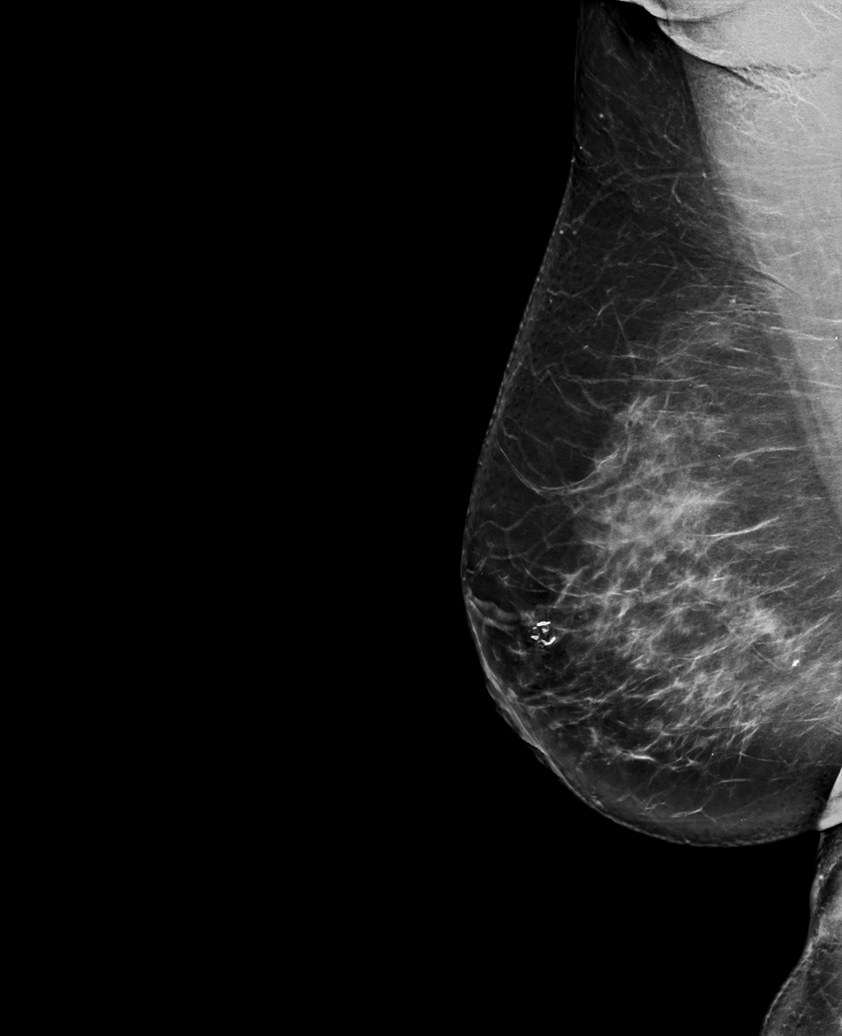

[L CC synth-2D (2 of 2)]
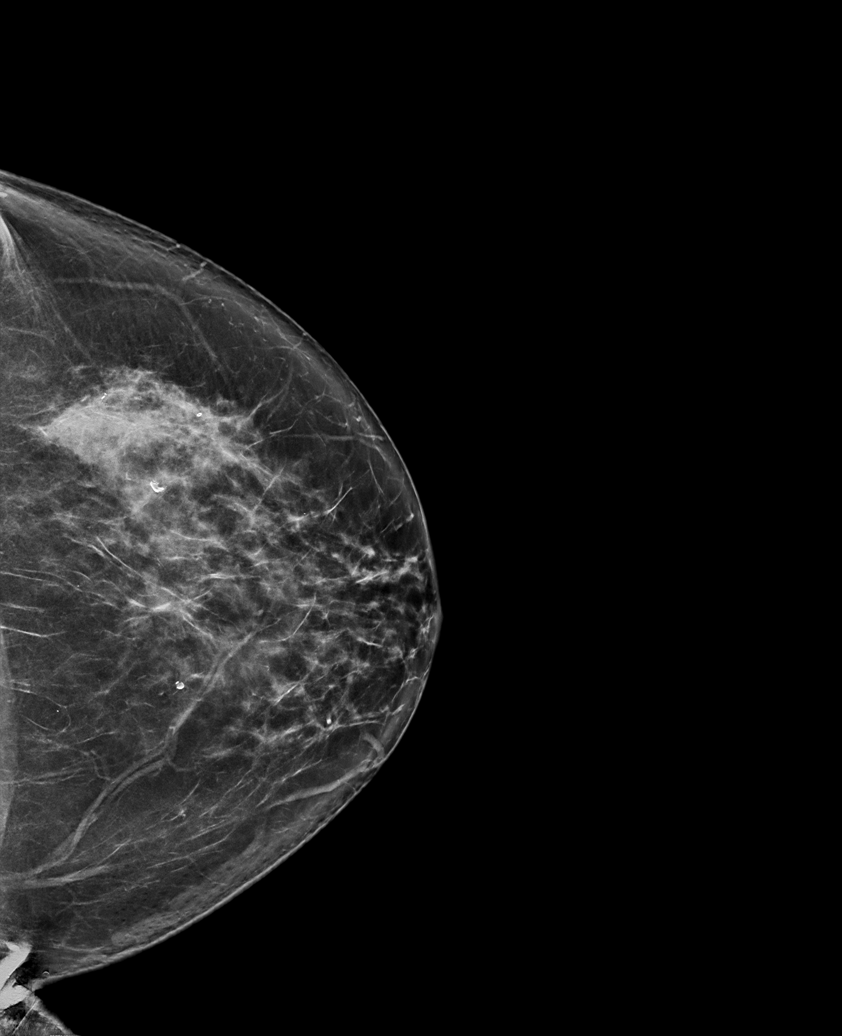

[L MLO synth-2D]
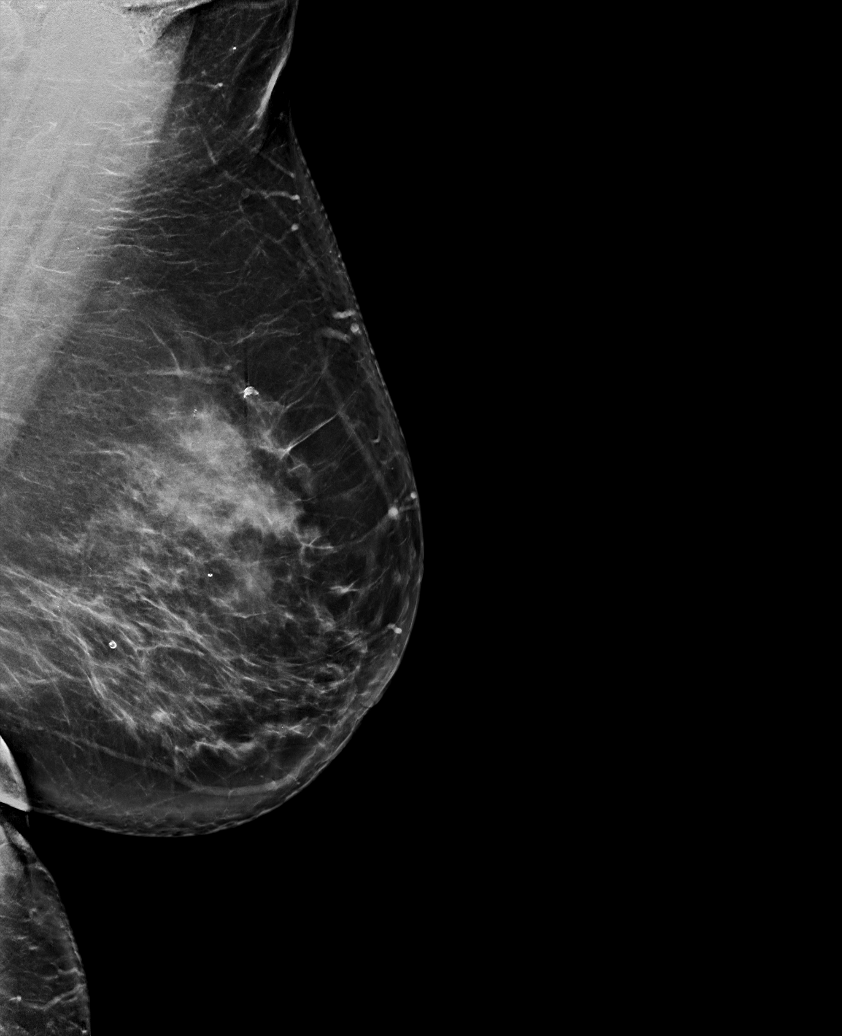

[R CC synth-2D]
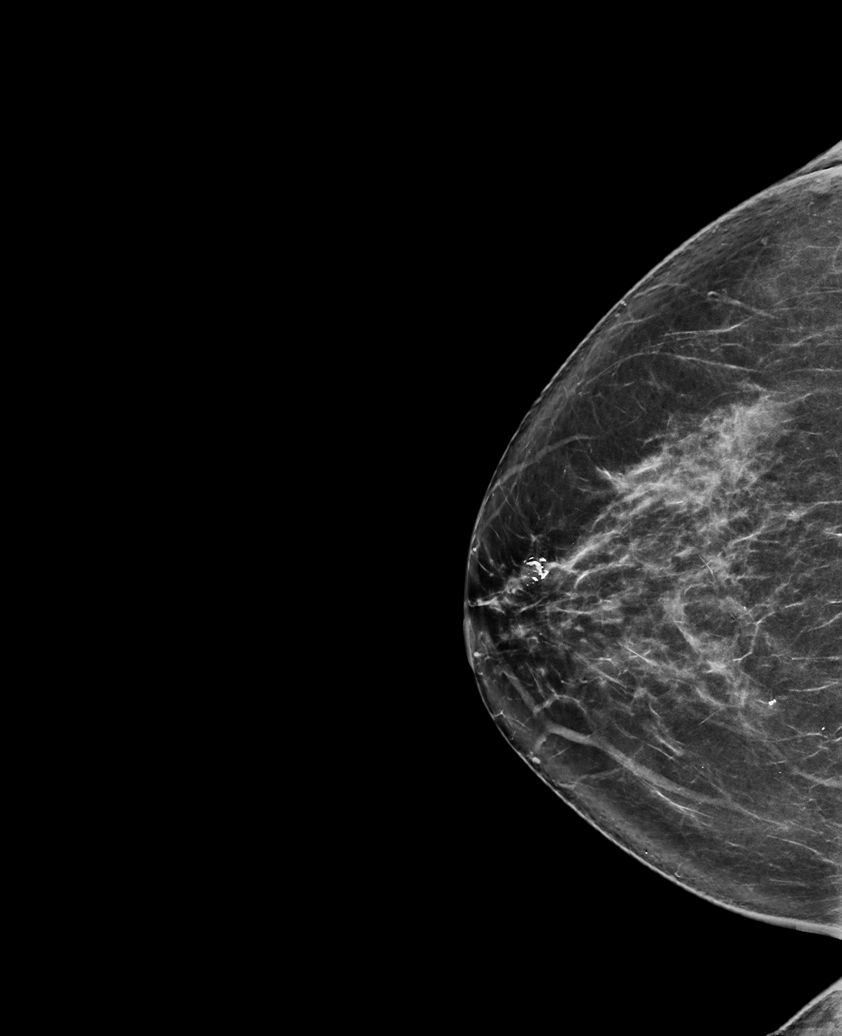

[R MLO tomo · tomo slice 47/94.0]
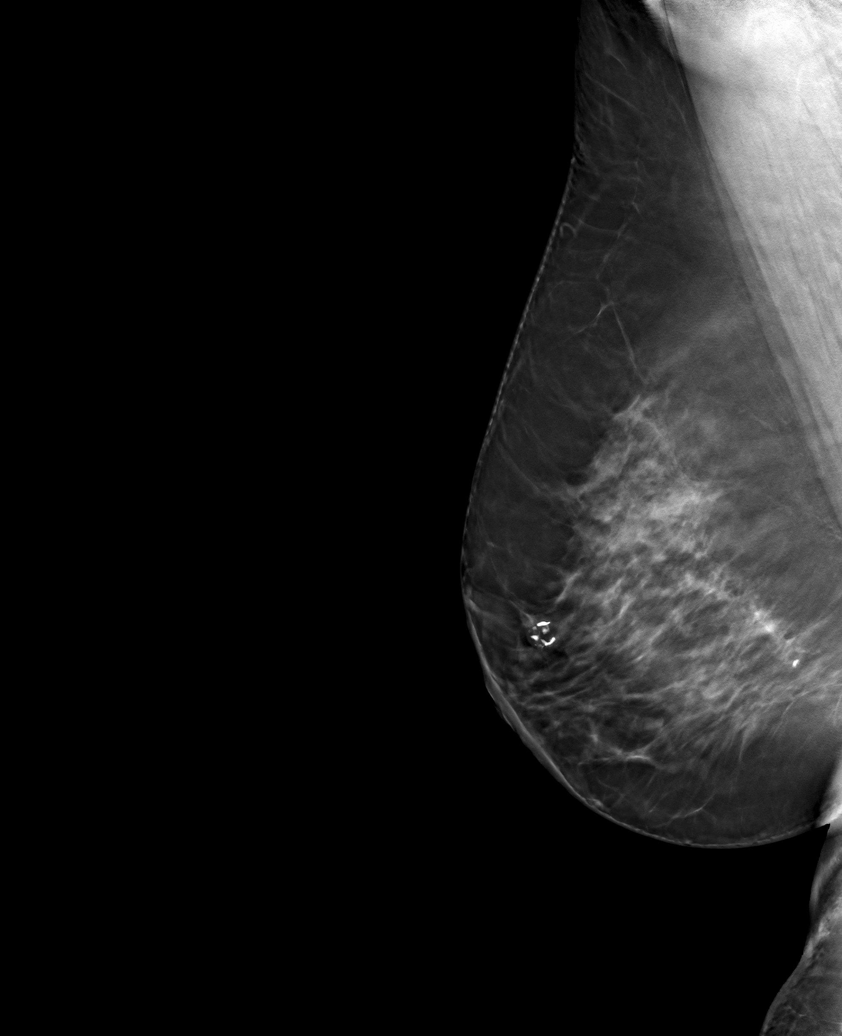

[6 of 30 positions shown; findings below may reference images not displayed]

ACR Breast Density Category c: The breast tissue is heterogeneously
dense, which may obscure small masses.
FINDINGS: There are no findings suspicious for malignancy.
IMPRESSION: No mammographic evidence of malignancy. A result letter of this
screening mammogram will be mailed directly to the patient.

RECOMMENDATION:
Screening mammogram in one year. (Code:[id number]-BC3)

BI-RADS CATEGORY  1: Negative.

## 2023-02-17 ENCOUNTER — Other Ambulatory Visit: Payer: Self-pay | Admitting: Family Medicine

## 2023-02-20 ENCOUNTER — Other Ambulatory Visit: Payer: Self-pay

## 2023-02-20 MED ORDER — SCOPOLAMINE 1 MG/3DAYS TD PT72
1.0000 | MEDICATED_PATCH | TRANSDERMAL | 0 refills | Status: DC
  Filled 2023-02-20: qty 10, 30d supply, fill #0

## 2023-02-20 NOTE — Telephone Encounter (Signed)
Requested medications are due for refill today.  yes  Requested medications are on the active medications list.  yes  Last refill. 10/10/2021 #10 12 rf  Future visit scheduled.   yes  Notes to clinic.  Medication not assigned to a protocol. Please review for refill.    Requested Prescriptions  Pending Prescriptions Disp Refills   scopolamine (TRANSDERM-SCOP) 1 MG/3DAYS 10 patch 12    Sig: Place 1 patch (1.5 mg total) onto the skin every 3 (three) days.     Off-Protocol Failed - 02/17/2023  2:41 PM      Failed - Medication not assigned to a protocol, review manually.      Failed - Valid encounter within last 12 months    Recent Outpatient Visits           1 year ago Essential hypertension   [place] [facility] [place], [place] P, DO   1 year ago Well adult exam   [facility] [place] Cory, MD   2 years ago Essential hypertension   [place] [facility] [place] Cory, MD   2 years ago Viral upper respiratory tract infection   [facility] Al[place] Cory, MD   2 years ago Dyslipidemia   [facility] Al[place] Cory, MD       Future Appointments             In 3 weeks Dorcas Carrow, DO [facility]

## 2023-03-01 ENCOUNTER — Encounter: Payer: 59 | Admitting: Family Medicine

## 2023-03-13 ENCOUNTER — Encounter: Payer: 59 | Admitting: Family Medicine

## 2023-03-27 ENCOUNTER — Other Ambulatory Visit: Payer: Self-pay

## 2023-03-27 ENCOUNTER — Ambulatory Visit (INDEPENDENT_AMBULATORY_CARE_PROVIDER_SITE_OTHER): Payer: 59 | Admitting: Family Medicine

## 2023-03-27 ENCOUNTER — Encounter: Payer: Self-pay | Admitting: Family Medicine

## 2023-03-27 VITALS — BP 158/87 | HR 67 | Ht 62.0 in | Wt 217.4 lb

## 2023-03-27 DIAGNOSIS — Z0000 Encounter for general adult medical examination without abnormal findings: Secondary | ICD-10-CM

## 2023-03-27 DIAGNOSIS — N611 Abscess of the breast and nipple: Secondary | ICD-10-CM

## 2023-03-27 DIAGNOSIS — R7301 Impaired fasting glucose: Secondary | ICD-10-CM

## 2023-03-27 DIAGNOSIS — E782 Mixed hyperlipidemia: Secondary | ICD-10-CM | POA: Diagnosis not present

## 2023-03-27 DIAGNOSIS — I10 Essential (primary) hypertension: Secondary | ICD-10-CM

## 2023-03-27 DIAGNOSIS — Z1231 Encounter for screening mammogram for malignant neoplasm of breast: Secondary | ICD-10-CM

## 2023-03-27 LAB — MICROALBUMIN, URINE WAIVED
Creatinine, Urine Waived: 100 mg/dL (ref 10–300)
Microalb, Ur Waived: 30 mg/L — ABNORMAL HIGH (ref 0–19)

## 2023-03-27 LAB — BAYER DCA HB A1C WAIVED: HB A1C (BAYER DCA - WAIVED): 6.2 % — ABNORMAL HIGH (ref 4.8–5.6)

## 2023-03-27 MED ORDER — SULFAMETHOXAZOLE-TRIMETHOPRIM 800-160 MG PO TABS
1.0000 | ORAL_TABLET | Freq: Two times a day (BID) | ORAL | 0 refills | Status: DC
  Filled 2023-03-27: qty 14, 7d supply, fill #0

## 2023-03-27 MED ORDER — LISINOPRIL 5 MG PO TABS
5.0000 mg | ORAL_TABLET | Freq: Every day | ORAL | 3 refills | Status: DC
  Filled 2023-03-27: qty 30, 30d supply, fill #0
  Filled 2023-04-25 – 2023-04-27 (×3): qty 30, 30d supply, fill #1

## 2023-03-27 NOTE — Progress Notes (Signed)
BP (!) 158/87   Pulse 67   Ht 5\' 2"  (1.575 m)   Wt 217 lb 6.4 oz (98.6 kg)   LMP 07/12/2011 Comment: Pt states she only weighs at PCP.  SpO2 97%   BMI 39.76 kg/m    Subjective:    Patient ID: Rachel Davidson, female    DOB: [birth year], 62 y.o.   MRN: [id number]  HPI: Rachel Davidson is a 62 y.o. female presenting on 03/27/2023 for comprehensive medical examination. Current medical complaints include:  HYPERTENSION / HYPERLIPIDEMIA Satisfied with current treatment? Hasn't been taking any medicine in about 4 years Duration of hypertension: chronic BP monitoring frequency: a few times a week BP range: 120s systolic BP medication side effects: no Past BP meds: triamterene, HCTZ Duration of hyperlipidemia: chronic Cholesterol medication side effects: N/A Cholesterol supplements: none Past cholesterol medications: none Medication compliance: poor compliance Aspirin: no Recent stressors: no Recurrent headaches: no Visual changes: no Palpitations: no Dyspnea: no Chest pain: no Lower extremity edema: no Dizzy/lightheaded: no  Impaired Fasting Glucose HbA1C:  Lab Results  Component Value Date   HGBA1C 6.3 (H) 06/29/2021   Duration of elevated blood sugar: chronic Polydipsia: no Polyuria: no Weight change: no Visual disturbance: no Glucose Monitoring: no Diabetic Education: Not Completed Family history of diabetes: yes  ABSCESS Duration: about 5 days Location: R upper outer quadrant Pain:  yes Quality:  sharp Severity: severe Redness:  yes Swelling:  yes Warmth:  yes Oozing:  no Pus:  no Treatments attempted:warm compresses Past similar infections:  yes Past MRSA skin infections:  no History of trauma in area:  yes Fevers:  no Nausea/vomiting:  no  She currently lives with: husband Menopausal Symptoms: no  Depression Screen done today and results listed below:     03/27/2023    1:55 PM 06/29/2021   11:31 AM 02/22/2021    3:16 PM 01/14/2021     8:21 AM 11/17/2020    9:14 AM  Depression screen PHQ 2/9  Decreased Interest 0 0 0 0 0  Down, Depressed, Hopeless 0 0 0 0 0  PHQ - 2 Score 0 0 0 0 0    Past Medical History:  Past Medical History:  Diagnosis Date   Diffuse cystic mastopathy 2012   Hypertension    Obesity, unspecified    Special screening for malignant neoplasms, colon 2013    Surgical History:  Past Surgical History:  Procedure Laterality Date   BREAST CYST ASPIRATION Left 1990s and 2012   Dr Evette Cristal   BREAST SURGERY Left 04/2002   biopsy-benign   CESAREAN SECTION  1996   CESAREAN SECTION and Bilateral Tubal Ligation  1999   COLONOSCOPY  12/16/2010   Dr. Elliott-hyperplastic polyp   DILATION AND CURETTAGE OF UTERUS  1995/ 1985   blighted ovum-CAK and incomplete AB-Dr CAK   ENDOMETRIAL BIOPSY  03/2009   proliferative   TONSILLECTOMY  1979    Medications:  No current outpatient medications on file prior to visit.   No current facility-administered medications on file prior to visit.    Allergies:  No Known Allergies  Social History:  Social History   Socioeconomic History   Marital status: Married    Spouse name: Not on file   Number of children: 2   Years of education: Not on file   Highest education level: Associate degree: academic program  Occupational History   Occupation: RN-surgical floor at [employer]  Tobacco Use   Smoking status: Never   Smokeless tobacco:  Never  Vaping Use   Vaping status: Never Used  Substance and Sexual Activity   Alcohol use: No    Alcohol/week: 0.0 standard drinks of alcohol   Drug use: No   Sexual activity: Yes    Partners: Male    Birth control/protection: Post-menopausal  Other Topics Concern   Not on file  Social History Narrative   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    One  child in college, one has already graduated (2020)    Social Determinants of Health   Financial Resource Strain: Low Risk  (03/23/2023)   Overall Financial Resource Strain  (CARDIA)    Difficulty of Paying Living Expenses: Not hard at all  Food Insecurity: No Food Insecurity (03/23/2023)   Hunger Vital Sign    Worried About Running Out of Food in the Last Year: Never true    Ran Out of Food in the Last Year: Never true  Transportation Needs: Patient Declined (03/23/2023)   PRAPARE - Administrator, Civil Service (Medical): Patient declined    Lack of Transportation (Non-Medical): Patient declined  Physical Activity: Unknown (03/23/2023)   Exercise Vital Sign    Days of Exercise per Week: Patient declined    Minutes of Exercise per Session: Not on file  Stress: No Stress Concern Present (03/23/2023)   [employer] of Occupational Health - Occupational Stress Questionnaire    Feeling of Stress : Not at all  Social Connections: Unknown (03/23/2023)   Social Connection and Isolation Panel [NHANES]    Frequency of Communication with Friends and Family: Patient declined    Frequency of Social Gatherings with Friends and Family: Patient declined    Attends Religious Services: Patient declined    Database administrator or Organizations: Yes    Attends Banker Meetings: Patient declined    Marital Status: Patient declined  Intimate Partner Violence: Not At Risk (02/22/2021)   Humiliation, Afraid, Rape, and Kick questionnaire    Fear of Current or Ex-Partner: No    Emotionally Abused: No    Physically Abused: No    Sexually Abused: No   Social History   Tobacco Use  Smoking Status Never  Smokeless Tobacco Never   Social History   Substance and Sexual Activity  Alcohol Use No   Alcohol/week: 0.0 standard drinks of alcohol    Family History:  Family History  Problem Relation Age of Onset   Cancer Father        lung   Diabetes Father    Heart disease Father    Hypertension Father    Hypertension Brother    Prostate cancer Maternal Grandfather    Hypertension Sister    Breast cancer Neg Hx     Past medical  history, surgical history, medications, allergies, family history and social history reviewed with patient today and changes made to appropriate areas of the chart.   Review of Systems  Constitutional: Negative.   HENT: Negative.    Eyes: Negative.   Respiratory: Negative.    Cardiovascular: Negative.   Gastrointestinal: Negative.   Genitourinary: Negative.   Musculoskeletal: Negative.   Skin: Negative.   Neurological: Negative.   Endo/Heme/Allergies:  Positive for environmental allergies. Negative for polydipsia. Does not bruise/bleed easily.  Psychiatric/Behavioral: Negative.     All other ROS negative except what is listed above and in the HPI.      Objective:    BP (!) 158/87   Pulse 67   Ht 5\' 2"  (1.575  m)   Wt 217 lb 6.4 oz (98.6 kg)   LMP 07/12/2011 Comment: Pt states she only weighs at PCP.  SpO2 97%   BMI 39.76 kg/m   Wt Readings from Last 3 Encounters:  03/27/23 217 lb 6.4 oz (98.6 kg)  06/29/21 210 lb (95.3 kg)  02/22/21 217 lb (98.4 kg)    Physical Exam Vitals and nursing note reviewed.  Constitutional:      General: She is not in acute distress.    Appearance: Normal appearance. She is not ill-appearing, toxic-appearing or diaphoretic.  HENT:     Head: Normocephalic and atraumatic.     Right Ear: Tympanic membrane, ear canal and external ear normal. There is no impacted cerumen.     Left Ear: Tympanic membrane, ear canal and external ear normal. There is no impacted cerumen.     Nose: Nose normal. No congestion or rhinorrhea.     Mouth/Throat:     Mouth: Mucous membranes are moist.     Pharynx: Oropharynx is clear. No oropharyngeal exudate or posterior oropharyngeal erythema.  Eyes:     General: No scleral icterus.       Right eye: No discharge.        Left eye: No discharge.     Extraocular Movements: Extraocular movements intact.     Conjunctiva/sclera: Conjunctivae normal.     Pupils: Pupils are equal, round, and reactive to light.  Neck:      Vascular: No carotid bruit.  Cardiovascular:     Rate and Rhythm: Normal rate and regular rhythm.     Pulses: Normal pulses.     Heart sounds: No murmur heard.    No friction rub. No gallop.  Pulmonary:     Effort: Pulmonary effort is normal. No respiratory distress.     Breath sounds: Normal breath sounds. No stridor. No wheezing, rhonchi or rales.  Chest:     Chest wall: No tenderness.    Abdominal:     General: Abdomen is flat. Bowel sounds are normal. There is no distension.     Palpations: Abdomen is soft. There is no mass.     Tenderness: There is no abdominal tenderness. There is no right CVA tenderness, left CVA tenderness, guarding or rebound.     Hernia: No hernia is present.  Genitourinary:    Comments: Breast and pelvic exams deferred with shared decision making Musculoskeletal:        General: No swelling, tenderness, deformity or signs of injury.     Cervical back: Normal range of motion and neck supple. No rigidity. No muscular tenderness.     Right lower leg: No edema.     Left lower leg: No edema.  Lymphadenopathy:     Cervical: No cervical adenopathy.  Skin:    General: Skin is warm and dry.     Capillary Refill: Capillary refill takes less than 2 seconds.     Coloration: Skin is not jaundiced or pale.     Findings: No bruising, erythema, lesion or rash.  Neurological:     General: No focal deficit present.     Mental Status: She is alert and oriented to person, place, and time. Mental status is at baseline.     Cranial Nerves: No cranial nerve deficit.     Sensory: No sensory deficit.     Motor: No weakness.     Coordination: Coordination normal.     Gait: Gait normal.     Deep Tendon Reflexes: Reflexes normal.  Psychiatric:        Mood and Affect: Mood normal.        Behavior: Behavior normal.        Thought Content: Thought content normal.        Judgment: Judgment normal.     Results for orders placed or performed in visit on 06/29/21  Cologuard   Result Value Ref Range   COLOGUARD Negative Negative  Comprehensive metabolic panel  Result Value Ref Range   Glucose 123 (H) 70 - 99 mg/dL   BUN 14 8 - 27 mg/dL   Creatinine, Ser 6.57 0.57 - 1.00 mg/dL   eGFR 91 >84 ON/GEX/5.28   BUN/Creatinine Ratio 19 12 - 28   Sodium 140 134 - 144 mmol/L   Potassium 4.1 3.5 - 5.2 mmol/L   Chloride 100 96 - 106 mmol/L   CO2 27 20 - 29 mmol/L   Calcium 9.6 8.7 - 10.3 mg/dL   Total Protein 6.5 6.0 - 8.5 g/dL   Albumin 4.5 3.8 - 4.8 g/dL   Globulin, Total 2.0 1.5 - 4.5 g/dL   Albumin/Globulin Ratio 2.3 (H) 1.2 - 2.2   Bilirubin Total 1.0 0.0 - 1.2 mg/dL   Alkaline Phosphatase 94 44 - 121 IU/L   AST 16 0 - 40 IU/L   ALT 23 0 - 32 IU/L  Lipid Panel w/o Chol/HDL Ratio  Result Value Ref Range   Cholesterol, Total 223 (H) 100 - 199 mg/dL   Triglycerides 413 0 - 149 mg/dL   HDL 54 >24 mg/dL   VLDL Cholesterol Cal 26 5 - 40 mg/dL   LDL Chol Calc (NIH) 401 (H) 0 - 99 mg/dL  Bayer DCA Hb U2V Waived  Result Value Ref Range   HB A1C (BAYER DCA - WAIVED) 6.3 (H) 4.8 - 5.6 %  Microalbumin, Urine Waived  Result Value Ref Range   Microalb, Ur Waived 10 0 - 19 mg/L   Creatinine, Urine Waived 50 10 - 300 mg/dL   Microalb/Creat Ratio <30 <30 mg/g      Assessment & Plan:   Problem List Items Addressed This Visit       Cardiovascular and Mediastinum   Essential hypertension    Running high. Will start lisinopril and recheck in 1 month. Call with any concerns.       Relevant Medications   lisinopril (ZESTRIL) 5 MG tablet   Other Relevant Orders   Comprehensive metabolic panel   Microalbumin, Urine Waived     Endocrine   IFG (impaired fasting glucose)    Rechecking labs today. Await results.       Relevant Orders   Comprehensive metabolic panel   Bayer DCA Hb O5D Waived     Other   Mixed hyperlipidemia    Rechecking labs today. Await results.       Relevant Medications   lisinopril (ZESTRIL) 5 MG tablet   Other Relevant Orders    Comprehensive metabolic panel   Lipid Panel w/o Chol/HDL Ratio   Other Visit Diagnoses     Routine general medical examination at a health care facility    -  Primary   Vaccines up to date/declined. Screening labs checked today. Pap up to date. Mammo ordered. Cologuard up to date. Continue diet and exercise. Call with concerns.   Relevant Orders   CBC with Differential/Platelet   Comprehensive metabolic panel   Lipid Panel w/o Chol/HDL Ratio   TSH   Microalbumin, Urine Waived   Bayer DCA Hb A1c Waived  Breast abscess       Will treat with bactrim. Call if not significantly better by Thursday   Encounter for screening mammogram for malignant neoplasm of breast       Mammo ordered today.   Relevant Orders   MM 3D SCREENING MAMMOGRAM BILATERAL BREAST        Follow up plan: Return 4-6 weeks.   LABORATORY TESTING:  - Pap smear: up to date  IMMUNIZATIONS:   - Tdap: Tetanus vaccination status reviewed: last tetanus booster within 10 years. - Influenza: Refused - Pneumovax: Not applicable - Prevnar: Not applicable - COVID: Refused - HPV: Not applicable - Shingrix vaccine: Up to date  SCREENING: -Mammogram: Ordered today  - Colonoscopy: Up to date   PATIENT COUNSELING:   Advised to take 1 mg of folate supplement per day if capable of pregnancy.   Sexuality: Discussed sexually transmitted diseases, partner selection, use of condoms, avoidance of unintended pregnancy  and contraceptive alternatives.   Advised to avoid cigarette smoking.  I discussed with the patient that most people either abstain from alcohol or drink within safe limits (<=14/week and <=4 drinks/occasion for males, <=7/weeks and <= 3 drinks/occasion for females) and that the risk for alcohol disorders and other health effects rises proportionally with the number of drinks per week and how often a drinker exceeds daily limits.  Discussed cessation/primary prevention of drug use and availability of treatment  for abuse.   Diet: Encouraged to adjust caloric intake to maintain  or achieve ideal body weight, to reduce intake of dietary saturated fat and total fat, to limit sodium intake by avoiding high sodium foods and not adding table salt, and to maintain adequate dietary potassium and calcium preferably from fresh fruits, vegetables, and low-fat dairy products.    stressed the importance of regular exercise  Injury prevention: Discussed safety belts, safety helmets, smoke detector, smoking near bedding or upholstery.   Dental health: Discussed importance of regular tooth brushing, flossing, and dental visits.    NEXT PREVENTATIVE PHYSICAL DUE IN 1 YEAR. Return 4-6 weeks.

## 2023-03-27 NOTE — Assessment & Plan Note (Signed)
Running high. Will start lisinopril and recheck in 1 month. Call with any concerns.

## 2023-03-27 NOTE — Assessment & Plan Note (Signed)
Rechecking labs today. Await results.  

## 2023-03-28 ENCOUNTER — Ambulatory Visit: Payer: Self-pay

## 2023-03-28 ENCOUNTER — Encounter: Payer: Self-pay | Admitting: Family Medicine

## 2023-03-28 LAB — CBC WITH DIFFERENTIAL/PLATELET
Basophils Absolute: 0.1 10*3/uL (ref 0.0–0.2)
Basos: 1 %
EOS (ABSOLUTE): 0.1 10*3/uL (ref 0.0–0.4)
Eos: 2 %
Hematocrit: 47 % — ABNORMAL HIGH (ref 34.0–46.6)
Hemoglobin: 15.5 g/dL (ref 11.1–15.9)
Immature Grans (Abs): 0.1 10*3/uL (ref 0.0–0.1)
Immature Granulocytes: 1 %
Lymphocytes Absolute: 2.1 10*3/uL (ref 0.7–3.1)
Lymphs: 25 %
MCH: 29.3 pg (ref 26.6–33.0)
MCHC: 33 g/dL (ref 31.5–35.7)
MCV: 89 fL (ref 79–97)
Monocytes Absolute: 0.7 10*3/uL (ref 0.1–0.9)
Monocytes: 8 %
Neutrophils Absolute: 5.3 10*3/uL (ref 1.4–7.0)
Neutrophils: 63 %
Platelets: 250 10*3/uL (ref 150–450)
RBC: 5.29 x10E6/uL — ABNORMAL HIGH (ref 3.77–5.28)
RDW: 13.3 % (ref 11.7–15.4)
WBC: 8.3 10*3/uL (ref 3.4–10.8)

## 2023-03-28 LAB — LIPID PANEL W/O CHOL/HDL RATIO
Cholesterol, Total: 238 mg/dL — ABNORMAL HIGH (ref 100–199)
HDL: 52 mg/dL (ref >39)
LDL Chol Calc (NIH): 156 mg/dL — ABNORMAL HIGH (ref 0–99)
Triglycerides: 168 mg/dL — ABNORMAL HIGH (ref 0–149)
VLDL Cholesterol Cal: 30 mg/dL (ref 5–40)

## 2023-03-28 LAB — COMPREHENSIVE METABOLIC PANEL
ALT: 27 [IU]/L (ref 0–32)
AST: 19 [IU]/L (ref 0–40)
Albumin: 4.4 g/dL (ref 3.9–4.9)
Alkaline Phosphatase: 139 [IU]/L — ABNORMAL HIGH (ref 44–121)
BUN/Creatinine Ratio: 19 (ref 12–28)
BUN: 14 mg/dL (ref 8–27)
Bilirubin Total: 1.1 mg/dL (ref 0.0–1.2)
CO2: 26 mmol/L (ref 20–29)
Calcium: 9.3 mg/dL (ref 8.7–10.3)
Chloride: 100 mmol/L (ref 96–106)
Creatinine, Ser: 0.75 mg/dL (ref 0.57–1.00)
Globulin, Total: 1.9 g/dL (ref 1.5–4.5)
Glucose: 114 mg/dL — ABNORMAL HIGH (ref 70–99)
Potassium: 3.7 mmol/L (ref 3.5–5.2)
Sodium: 140 mmol/L (ref 134–144)
Total Protein: 6.3 g/dL (ref 6.0–8.5)
eGFR: 89 mL/min/{1.73_m2} (ref >59)

## 2023-03-28 LAB — TSH: TSH: 3.18 u[IU]/mL (ref 0.450–4.500)

## 2023-03-28 NOTE — Telephone Encounter (Signed)
Chief Complaint: breast Abscess Symptoms: right breast pain, red, warm to touch Frequency: constant  Pertinent Negatives: Patient denies fever, nausea, vomiting  Disposition: [] ED /[] Urgent Care (no appt availability in office) / [] Appointment(In office/virtual)/ []  [facility]/ [] Home Care/ [] Refused Recommended Disposition /[]  Mobile Bus/ [x]  Follow-up with PCP Additional Notes: Patient states she is being treated with antibiotics for a right breast abscess. Patient states she has taking 3 doses of the antibiotic so far and the it appears that another lump has pooped up right next to the other one. Patient stated she was advised by provider to call if her symptoms got worse. Patient reports pain 8/10 with movement but if she is sitting still the pain is maybe a 3/10 on the pain scale. Care advice was given and advised patient I would forward message to provider for additional recommendations.   Reason for Disposition  [1] Treatment (antibiotic, I&D, or moist heat) > 24 hours AND [2] symptoms WORSE (e.g., pain, spreading redness, swelling)  Answer Assessment - Initial Assessment Questions 1. APPEARANCE: "What does the boil (abscess) look like?"      Red, warm to touch  2. LOCATION: "Where is the boil located?"      Right breast  3. NUMBER: "How many boils are there?"      2  4. SIZE: "How big is the boil?" (e.g., inches, cm; compare to size of a coin or other object)     It feels like a new lump has started to develop next to it  5. ONSET: "When did the boil start?"     Thursday afternoon 6. PAIN: "Is there any pain?" If Yes, ask: "How bad is the pain?"  (Scale 1-10; or mild, moderate, severe)     8/10 with movement, 3/10 without  7. FEVER: "Do you have a fever?" If Yes, ask: "What is it, how was it measured, and when did it start?"      No  8. TREATMENT: "What treatment did you get or are you getting for the boil?" (e.g., I&D, antibiotics, moist heat)     Antibiotic  3 doses so far (24hrs worth) 9. OTHER SYMPTOMS: "Do you have any other symptoms?" (e.g., rash elsewhere on body, shaking chills, spreading redness of nearby skin or red streaks, weakness)      No  Protocols used: Boil (Skin Abscess) on Treatment Follow-up Call-[facility]

## 2023-03-29 ENCOUNTER — Ambulatory Visit: Payer: 59 | Admitting: Family Medicine

## 2023-03-29 VITALS — BP 171/78 | HR 71 | Wt 217.0 lb

## 2023-03-29 DIAGNOSIS — N611 Abscess of the breast and nipple: Secondary | ICD-10-CM

## 2023-03-29 NOTE — Telephone Encounter (Signed)
Scheduled patient on 03/29/2023 @ 4:00 pm.

## 2023-03-29 NOTE — Telephone Encounter (Addendum)
Please get her in for I&D- Destiny if you see the best time to double book can you let them know (if there's not an opening)

## 2023-03-30 ENCOUNTER — Encounter: Payer: Self-pay | Admitting: Family Medicine

## 2023-03-30 ENCOUNTER — Ambulatory Visit: Payer: 59 | Admitting: Family Medicine

## 2023-03-30 DIAGNOSIS — N611 Abscess of the breast and nipple: Secondary | ICD-10-CM | POA: Diagnosis not present

## 2023-03-30 NOTE — Progress Notes (Signed)
Abscess doing well. Wick changed. 3mL 1% lidocaine put into wound to help with comfort. Recheck on Monday.

## 2023-04-02 ENCOUNTER — Encounter: Payer: Self-pay | Admitting: Family Medicine

## 2023-04-02 ENCOUNTER — Ambulatory Visit: Payer: 59 | Admitting: Family Medicine

## 2023-04-02 DIAGNOSIS — N611 Abscess of the breast and nipple: Secondary | ICD-10-CM

## 2023-04-02 NOTE — Progress Notes (Signed)
Packing changed today with good results. Wound healing well.

## 2023-04-03 ENCOUNTER — Encounter: Payer: Self-pay | Admitting: Family Medicine

## 2023-04-03 NOTE — Progress Notes (Signed)
BP (!) 171/78   Pulse 71   Wt 217 lb (98.4 kg)   LMP 07/12/2011 Comment: Pt states she only weighs at PCP.  SpO2 98%   BMI 39.69 kg/m    Subjective:    Patient ID: Rachel Davidson, female    DOB: 1961/04/12, 62 y.o.   MRN: [id number]  HPI: SAVREEN GEBHARDT is a 62 y.o. female  Chief Complaint  Patient presents with   Abscess    Patient is here for Abscess on her breast. Patient is currently on antibiotics and says that the pain and discomfort isn't getting any better. Patient says she think she noticed another bump in the same area.    ABSCESS Duration: about 8 days Location: R breast Pain:  yes Quality:  aching, sore, tight, shooting Severity: severe Redness:  yes Swelling:  yes Warmth:  yes Oozing:  no Pus:  no Treatments attempted:antibiotics Past similar infections:  no Past MRSA skin infections:  no History of trauma in area:  no Fevers:  no Nausea/vomiting:  no  Relevant past medical, surgical, family and social history reviewed and updated as indicated. Interim medical history since our last visit reviewed. Allergies and medications reviewed and updated.  Review of Systems  Constitutional: Negative.   Respiratory: Negative.    Cardiovascular: Negative.   Gastrointestinal: Negative.   Musculoskeletal: Negative.   Skin:  Positive for rash. Negative for color change, pallor and wound.  Psychiatric/Behavioral: Negative.      Per HPI unless specifically indicated above     Objective:    BP (!) 171/78   Pulse 71   Wt 217 lb (98.4 kg)   LMP 07/12/2011 Comment: Pt states she only weighs at PCP.  SpO2 98%   BMI 39.69 kg/m   Wt Readings from Last 3 Encounters:  03/29/23 217 lb (98.4 kg)  03/27/23 217 lb 6.4 oz (98.6 kg)  06/29/21 210 lb (95.3 kg)    Physical Exam Vitals and nursing note reviewed.  Constitutional:      General: She is not in acute distress.    Appearance: Normal appearance. She is not ill-appearing, toxic-appearing or  diaphoretic.  HENT:     Head: Normocephalic and atraumatic.     Right Ear: External ear normal.     Left Ear: External ear normal.     Nose: Nose normal.     Mouth/Throat:     Mouth: Mucous membranes are moist.     Pharynx: Oropharynx is clear.  Eyes:     General: No scleral icterus.       Right eye: No discharge.        Left eye: No discharge.     Extraocular Movements: Extraocular movements intact.     Conjunctiva/sclera: Conjunctivae normal.     Pupils: Pupils are equal, round, and reactive to light.  Cardiovascular:     Rate and Rhythm: Normal rate and regular rhythm.     Pulses: Normal pulses.     Heart sounds: Normal heart sounds. No murmur heard.    No friction rub. No gallop.  Pulmonary:     Effort: Pulmonary effort is normal. No respiratory distress.     Breath sounds: Normal breath sounds. No stridor. No wheezing, rhonchi or rales.  Chest:     Chest wall: No tenderness.  Musculoskeletal:        General: Normal range of motion.     Cervical back: Normal range of motion and neck supple.  Skin:    General:  Skin is warm and dry.     Capillary Refill: Capillary refill takes less than 2 seconds.     Coloration: Skin is not jaundiced or pale.     Findings: No bruising, erythema, lesion or rash.     Comments: 3 inch fluctuant, erythematous, hot abscess on R upper outer quadrant of her breast  Neurological:     General: No focal deficit present.     Mental Status: She is alert and oriented to person, place, and time. Mental status is at baseline.  Psychiatric:        Mood and Affect: Mood normal.        Behavior: Behavior normal.        Thought Content: Thought content normal.        Judgment: Judgment normal.     Results for orders placed or performed in visit on 03/27/23  CBC with Differential/Platelet  Result Value Ref Range   WBC 8.3 3.4 - 10.8 x10E3/uL   RBC 5.29 (H) 3.77 - 5.28 x10E6/uL   Hemoglobin 15.5 11.1 - 15.9 g/dL   Hematocrit 24.4 (H) 01.0 - 46.6 %    MCV 89 79 - 97 fL   MCH 29.3 26.6 - 33.0 pg   MCHC 33.0 31.5 - 35.7 g/dL   RDW 27.2 53.6 - 64.4 %   Platelets 250 150 - 450 x10E3/uL   Neutrophils 63 Not Estab. %   Lymphs 25 Not Estab. %   Monocytes 8 Not Estab. %   Eos 2 Not Estab. %   Basos 1 Not Estab. %   Neutrophils Absolute 5.3 1.4 - 7.0 x10E3/uL   Lymphocytes Absolute 2.1 0.7 - 3.1 x10E3/uL   Monocytes Absolute 0.7 0.1 - 0.9 x10E3/uL   EOS (ABSOLUTE) 0.1 0.0 - 0.4 x10E3/uL   Basophils Absolute 0.1 0.0 - 0.2 x10E3/uL   Immature Granulocytes 1 Not Estab. %   Immature Grans (Abs) 0.1 0.0 - 0.1 x10E3/uL  Comprehensive metabolic panel  Result Value Ref Range   Glucose 114 (H) 70 - 99 mg/dL   BUN 14 8 - 27 mg/dL   Creatinine, Ser 0.34 0.57 - 1.00 mg/dL   eGFR 89 >74 QV/ZDG/3.87   BUN/Creatinine Ratio 19 12 - 28   Sodium 140 134 - 144 mmol/L   Potassium 3.7 3.5 - 5.2 mmol/L   Chloride 100 96 - 106 mmol/L   CO2 26 20 - 29 mmol/L   Calcium 9.3 8.7 - 10.3 mg/dL   Total Protein 6.3 6.0 - 8.5 g/dL   Albumin 4.4 3.9 - 4.9 g/dL   Globulin, Total 1.9 1.5 - 4.5 g/dL   Bilirubin Total 1.1 0.0 - 1.2 mg/dL   Alkaline Phosphatase 139 (H) 44 - 121 IU/L   AST 19 0 - 40 IU/L   ALT 27 0 - 32 IU/L  Lipid Panel w/o Chol/HDL Ratio  Result Value Ref Range   Cholesterol, Total 238 (H) 100 - 199 mg/dL   Triglycerides 564 (H) 0 - 149 mg/dL   HDL 52 >33 mg/dL   VLDL Cholesterol Cal 30 5 - 40 mg/dL   LDL Chol Calc (NIH) 295 (H) 0 - 99 mg/dL  TSH  Result Value Ref Range   TSH 3.180 0.450 - 4.500 uIU/mL  Microalbumin, Urine Waived  Result Value Ref Range   Microalb, Ur Waived 30 (H) 0 - 19 mg/L   Creatinine, Urine Waived 100 10 - 300 mg/dL   Microalb/Creat Ratio 30-300 (H) <30 mg/g  Bayer DCA Hb A1c Waived  Result Value Ref Range   HB A1C (BAYER DCA - WAIVED) 6.2 (H) 4.8 - 5.6 %      Assessment & Plan:   Problem List Items Addressed This Visit   None Visit Diagnoses     Breast abscess    -  Primary   I&D'd today as below.  Continue bactrim. Follow up 1 day for wick change      Skin Procedure  Procedure: I&D  Diagnosis:   ICD-10-CM   1. Breast abscess  N61.1    I&D'd today as below. Continue bactrim. Follow up 1 day for wick change      Lesion Location/Size: 3 inch fluctuant, erythematous, hot abscess on R upper outer quadrant of her breast Physician: MJ Consent:  Risks, benefits, and alternative treatments discussed and all questions were answered.  Patient elected to proceed and verbal consent obtained.  Description: Area prepped and draped using semi-sterile technique. Area locally anesthetized using 5 cc's of lidocaine 1% plain. Using a 15 blade scalpel, a  3cm incision was made above the lesion. Cyst cavity entered and copious amount of purulent material expressed.  Wound packed with iodoform guaze and dressed with non-stick pad.  Post Procedure Instructions:  Wound care instructions discussed and patient was instructed to keep area clean and dry.  Signs and symptoms of infection discussed, patient agrees to contact the office ASAP should they occur.     Follow up plan: Return in about 1 day (around 03/30/2023) for wick change.

## 2023-04-04 ENCOUNTER — Encounter: Payer: Self-pay | Admitting: Family Medicine

## 2023-04-04 ENCOUNTER — Ambulatory Visit: Payer: 59 | Admitting: Family Medicine

## 2023-04-04 VITALS — BP 110/69 | HR 70 | Temp 98.7°F

## 2023-04-04 DIAGNOSIS — N611 Abscess of the breast and nipple: Secondary | ICD-10-CM

## 2023-04-04 NOTE — Progress Notes (Signed)
Healing well. Still with a good amount of drainage- will pack again and recheck 2 days.

## 2023-04-06 ENCOUNTER — Ambulatory Visit (INDEPENDENT_AMBULATORY_CARE_PROVIDER_SITE_OTHER): Payer: 59 | Admitting: Family Medicine

## 2023-04-06 ENCOUNTER — Encounter: Payer: Self-pay | Admitting: Family Medicine

## 2023-04-06 DIAGNOSIS — N611 Abscess of the breast and nipple: Secondary | ICD-10-CM

## 2023-04-06 NOTE — Progress Notes (Signed)
Healing well. No need to pack today. Dressing changed. Call with any concerns.

## 2023-04-11 ENCOUNTER — Telehealth: Payer: Self-pay | Admitting: Family Medicine

## 2023-04-11 NOTE — Telephone Encounter (Signed)
Copied from CRM [id number]. Topic: General - Other >> Apr 11, 2023 11:03 AM Epimenio Foot F wrote: Reason for CRM: Pt is calling in requesting to speak with the office manager. Pt would like for the office manager to call her back at her earliest convenience

## 2023-04-20 NOTE — Telephone Encounter (Signed)
Spoke with patient regarding copayment concerns. Explained to patient that her copayment amounts were applied to balance due for DOS 03/27/23. Patient acknowledged understanding.

## 2023-04-25 ENCOUNTER — Other Ambulatory Visit: Payer: Self-pay

## 2023-04-27 ENCOUNTER — Other Ambulatory Visit: Payer: Self-pay

## 2023-05-14 ENCOUNTER — Ambulatory Visit: Payer: 59 | Admitting: Family Medicine

## 2023-05-14 ENCOUNTER — Other Ambulatory Visit: Payer: Self-pay

## 2023-05-14 ENCOUNTER — Encounter: Payer: Self-pay | Admitting: Family Medicine

## 2023-05-14 VITALS — BP 118/70 | HR 72 | Wt 214.4 lb

## 2023-05-14 DIAGNOSIS — I10 Essential (primary) hypertension: Secondary | ICD-10-CM | POA: Diagnosis not present

## 2023-05-14 MED ORDER — LISINOPRIL 5 MG PO TABS
5.0000 mg | ORAL_TABLET | Freq: Every day | ORAL | 1 refills | Status: DC
  Filled 2023-05-14 – 2023-05-22 (×2): qty 30, 30d supply, fill #0
  Filled 2023-09-17: qty 30, 30d supply, fill #1

## 2023-05-14 NOTE — Assessment & Plan Note (Signed)
Under good control on current regimen. Continue current regimen. Continue to monitor. Call with any concerns. Refills given. Labs drawn today.   

## 2023-05-14 NOTE — Progress Notes (Signed)
BP 118/70   Pulse 72   Wt 214 lb 6.4 oz (97.3 kg)   LMP 07/12/2011 Comment: Pt states she only weighs at PCP.  SpO2 97%   BMI 39.21 kg/m    Subjective:    Patient ID: Rachel Davidson, female    DOB: [birth year], 62 y.o.   MRN: [id number]  HPI: Rachel Davidson is a 62 y.o. female  Chief Complaint  Patient presents with   Hypertension   HYPERTENSION  Hypertension status: controlled  Satisfied with current treatment? yes Duration of hypertension: chronic BP monitoring frequency:  not checking BP medication side effects:  no Medication compliance: excellent compliance Previous BP meds: lisinopril Aspirin: no Recurrent headaches: no Visual changes: no Palpitations: no Dyspnea: no Chest pain: no Lower extremity edema: no Dizzy/lightheaded: no  Relevant past medical, surgical, family and social history reviewed and updated as indicated. Interim medical history since our last visit reviewed. Allergies and medications reviewed and updated.  Review of Systems  Constitutional: Negative.   Respiratory: Negative.    Cardiovascular: Negative.   Musculoskeletal: Negative.   Neurological: Negative.   Psychiatric/Behavioral: Negative.      Per HPI unless specifically indicated above     Objective:    BP 118/70   Pulse 72   Wt 214 lb 6.4 oz (97.3 kg)   LMP 07/12/2011 Comment: Pt states she only weighs at PCP.  SpO2 97%   BMI 39.21 kg/m   Wt Readings from Last 3 Encounters:  05/14/23 214 lb 6.4 oz (97.3 kg)  03/29/23 217 lb (98.4 kg)  03/27/23 217 lb 6.4 oz (98.6 kg)    Physical Exam Vitals and nursing note reviewed.  Constitutional:      General: She is not in acute distress.    Appearance: Normal appearance. She is not ill-appearing, toxic-appearing or diaphoretic.  HENT:     Head: Normocephalic and atraumatic.     Right Ear: External ear normal.     Left Ear: External ear normal.     Nose: Nose normal.     Mouth/Throat:     Mouth: Mucous membranes are  moist.     Pharynx: Oropharynx is clear.  Eyes:     General: No scleral icterus.       Right eye: No discharge.        Left eye: No discharge.     Extraocular Movements: Extraocular movements intact.     Conjunctiva/sclera: Conjunctivae normal.     Pupils: Pupils are equal, round, and reactive to light.  Cardiovascular:     Rate and Rhythm: Normal rate and regular rhythm.     Pulses: Normal pulses.     Heart sounds: Normal heart sounds. No murmur heard.    No friction rub. No gallop.  Pulmonary:     Effort: Pulmonary effort is normal. No respiratory distress.     Breath sounds: Normal breath sounds. No stridor. No wheezing, rhonchi or rales.  Chest:     Chest wall: No tenderness.  Musculoskeletal:        General: Normal range of motion.     Cervical back: Normal range of motion and neck supple.  Skin:    General: Skin is warm and dry.     Capillary Refill: Capillary refill takes less than 2 seconds.     Coloration: Skin is not jaundiced or pale.     Findings: No bruising, erythema, lesion or rash.  Neurological:     General: No focal deficit present.  Mental Status: She is alert and oriented to person, place, and time. Mental status is at baseline.  Psychiatric:        Mood and Affect: Mood normal.        Behavior: Behavior normal.        Thought Content: Thought content normal.        Judgment: Judgment normal.     Results for orders placed or performed in visit on 03/27/23  Microalbumin, Urine Waived   Collection Time: 03/27/23  2:03 PM  Result Value Ref Range   Microalb, Ur Waived 30 (H) 0 - 19 mg/L   Creatinine, Urine Waived 100 10 - 300 mg/dL   Microalb/Creat Ratio 30-300 (H) <30 mg/g  Bayer DCA Hb A1c Waived   Collection Time: 03/27/23  2:03 PM  Result Value Ref Range   HB A1C (BAYER DCA - WAIVED) 6.2 (H) 4.8 - 5.6 %  CBC with Differential/Platelet   Collection Time: 03/27/23  2:10 PM  Result Value Ref Range   WBC 8.3 3.4 - 10.8 x10E3/uL   RBC 5.29 (H)  3.77 - 5.28 x10E6/uL   Hemoglobin 15.5 11.1 - 15.9 g/dL   Hematocrit 16.1 (H) 09.6 - 46.6 %   MCV 89 79 - 97 fL   MCH 29.3 26.6 - 33.0 pg   MCHC 33.0 31.5 - 35.7 g/dL   RDW 04.5 40.9 - 81.1 %   Platelets 250 150 - 450 x10E3/uL   Neutrophils 63 Not Estab. %   Lymphs 25 Not Estab. %   Monocytes 8 Not Estab. %   Eos 2 Not Estab. %   Basos 1 Not Estab. %   Neutrophils Absolute 5.3 1.4 - 7.0 x10E3/uL   Lymphocytes Absolute 2.1 0.7 - 3.1 x10E3/uL   Monocytes Absolute 0.7 0.1 - 0.9 x10E3/uL   EOS (ABSOLUTE) 0.1 0.0 - 0.4 x10E3/uL   Basophils Absolute 0.1 0.0 - 0.2 x10E3/uL   Immature Granulocytes 1 Not Estab. %   Immature Grans (Abs) 0.1 0.0 - 0.1 x10E3/uL  Comprehensive metabolic panel   Collection Time: 03/27/23  2:10 PM  Result Value Ref Range   Glucose 114 (H) 70 - 99 mg/dL   BUN 14 8 - 27 mg/dL   Creatinine, Ser 9.14 0.57 - 1.00 mg/dL   eGFR 89 >78 GN/FAO/1.30   BUN/Creatinine Ratio 19 12 - 28   Sodium 140 134 - 144 mmol/L   Potassium 3.7 3.5 - 5.2 mmol/L   Chloride 100 96 - 106 mmol/L   CO2 26 20 - 29 mmol/L   Calcium 9.3 8.7 - 10.3 mg/dL   Total Protein 6.3 6.0 - 8.5 g/dL   Albumin 4.4 3.9 - 4.9 g/dL   Globulin, Total 1.9 1.5 - 4.5 g/dL   Bilirubin Total 1.1 0.0 - 1.2 mg/dL   Alkaline Phosphatase 139 (H) 44 - 121 IU/L   AST 19 0 - 40 IU/L   ALT 27 0 - 32 IU/L  Lipid Panel w/o Chol/HDL Ratio   Collection Time: 03/27/23  2:10 PM  Result Value Ref Range   Cholesterol, Total 238 (H) 100 - 199 mg/dL   Triglycerides 865 (H) 0 - 149 mg/dL   HDL 52 >78 mg/dL   VLDL Cholesterol Cal 30 5 - 40 mg/dL   LDL Chol Calc (NIH) 469 (H) 0 - 99 mg/dL  TSH   Collection Time: 03/27/23  2:10 PM  Result Value Ref Range   TSH 3.180 0.450 - 4.500 uIU/mL      Assessment &  Plan:   Problem List Items Addressed This Visit       Cardiovascular and Mediastinum   Essential hypertension - Primary   Under good control on current regimen. Continue current regimen. Continue to monitor. Call  with any concerns. Refills given. Labs drawn today.       Relevant Medications   lisinopril (ZESTRIL) 5 MG tablet   Other Relevant Orders   Basic metabolic panel     Follow up plan: Return in about 6 months (around 11/12/2023).

## 2023-05-15 LAB — BASIC METABOLIC PANEL
BUN/Creatinine Ratio: 23 (ref 12–28)
BUN: 17 mg/dL (ref 8–27)
CO2: 22 mmol/L (ref 20–29)
Calcium: 9.5 mg/dL (ref 8.7–10.3)
Chloride: 103 mmol/L (ref 96–106)
Creatinine, Ser: 0.75 mg/dL (ref 0.57–1.00)
Glucose: 137 mg/dL — ABNORMAL HIGH (ref 70–99)
Potassium: 3.8 mmol/L (ref 3.5–5.2)
Sodium: 143 mmol/L (ref 134–144)
eGFR: 89 mL/min/{1.73_m2} (ref >59)

## 2023-05-22 ENCOUNTER — Other Ambulatory Visit: Payer: Self-pay

## 2023-11-13 ENCOUNTER — Encounter: Payer: Self-pay | Admitting: Family Medicine

## 2023-11-13 ENCOUNTER — Other Ambulatory Visit: Payer: Self-pay

## 2023-11-13 ENCOUNTER — Ambulatory Visit: Payer: Self-pay | Admitting: Family Medicine

## 2023-11-13 VITALS — BP 115/74 | HR 60 | Temp 97.7°F | Ht 62.0 in | Wt 217.8 lb

## 2023-11-13 DIAGNOSIS — I10 Essential (primary) hypertension: Secondary | ICD-10-CM | POA: Diagnosis not present

## 2023-11-13 DIAGNOSIS — E782 Mixed hyperlipidemia: Secondary | ICD-10-CM | POA: Diagnosis not present

## 2023-11-13 DIAGNOSIS — R7301 Impaired fasting glucose: Secondary | ICD-10-CM

## 2023-11-13 DIAGNOSIS — E538 Deficiency of other specified B group vitamins: Secondary | ICD-10-CM

## 2023-11-13 LAB — BAYER DCA HB A1C WAIVED: HB A1C (BAYER DCA - WAIVED): 6.1 % — ABNORMAL HIGH (ref 4.8–5.6)

## 2023-11-13 MED ORDER — LISINOPRIL 5 MG PO TABS
5.0000 mg | ORAL_TABLET | Freq: Every day | ORAL | 1 refills | Status: AC
  Filled 2023-11-13 – 2023-11-30 (×2): qty 30, 30d supply, fill #0
  Filled 2024-03-04: qty 30, 30d supply, fill #1
  Filled 2024-05-08: qty 30, 30d supply, fill #2

## 2023-11-13 NOTE — Assessment & Plan Note (Signed)
 Rechecking labs today. Await results. Treat as needed.

## 2023-11-13 NOTE — Progress Notes (Signed)
 BP 115/74 (BP Location: Left Arm, Patient Position: Sitting, Cuff Size: Large)   Pulse 60   Temp 97.7 F (36.5 C) (Oral)   Ht 5' 2 (1.575 m)   Wt 217 lb 12.8 oz (98.8 kg)   LMP 07/12/2011 Comment: Pt states she only weighs at PCP.  SpO2 97%   BMI 39.84 kg/m    Subjective:    Patient ID: Rachel Davidson, female    DOB: 1961/03/12, 63 y.o.   MRN: [id number]  HPI: Rachel Davidson is a 63 y.o. female  Chief Complaint  Patient presents with   Hypertension   HYPERTENSION / HYPERLIPIDEMIA Satisfied with current treatment? yes Duration of hypertension: chronic BP monitoring frequency: not checking BP medication side effects: no Past BP meds: lisinopril  Duration of hyperlipidemia: chronic Cholesterol medication side effects: N/A Cholesterol supplements: none Past cholesterol medications: none Medication compliance: excellent compliance Aspirin: no Recent stressors: no Recurrent headaches: no Visual changes: no Palpitations: no Dyspnea: no Chest pain: no Lower extremity edema: no Dizzy/lightheaded: no  Impaired Fasting Glucose HbA1C:  Lab Results  Component Value Date   HGBA1C 6.2 (H) 03/27/2023   Duration of elevated blood sugar: chronic Polydipsia: no Polyuria: no Weight change: no Visual disturbance: no Glucose Monitoring: no Diabetic Education: Not Completed Family history of diabetes: no  Relevant past medical, surgical, family and social history reviewed and updated as indicated. Interim medical history since our last visit reviewed. Allergies and medications reviewed and updated.  Review of Systems  Constitutional: Negative.   Respiratory: Negative.    Cardiovascular: Negative.   Musculoskeletal: Negative.   Neurological: Negative.   Psychiatric/Behavioral: Negative.      Per HPI unless specifically indicated above     Objective:    BP 115/74 (BP Location: Left Arm, Patient Position: Sitting, Cuff Size: Large)   Pulse 60   Temp 97.7 F  (36.5 C) (Oral)   Ht 5' 2 (1.575 m)   Wt 217 lb 12.8 oz (98.8 kg)   LMP 07/12/2011 Comment: Pt states she only weighs at PCP.  SpO2 97%   BMI 39.84 kg/m   Wt Readings from Last 3 Encounters:  11/13/23 217 lb 12.8 oz (98.8 kg)  05/14/23 214 lb 6.4 oz (97.3 kg)  03/29/23 217 lb (98.4 kg)    Physical Exam Vitals and nursing note reviewed.  Constitutional:      General: She is not in acute distress.    Appearance: Normal appearance. She is not ill-appearing, toxic-appearing or diaphoretic.  HENT:     Head: Normocephalic and atraumatic.     Right Ear: External ear normal.     Left Ear: External ear normal.     Nose: Nose normal.     Mouth/Throat:     Mouth: Mucous membranes are moist.     Pharynx: Oropharynx is clear.   Eyes:     General: No scleral icterus.       Right eye: No discharge.        Left eye: No discharge.     Extraocular Movements: Extraocular movements intact.     Conjunctiva/sclera: Conjunctivae normal.     Pupils: Pupils are equal, round, and reactive to light.    Cardiovascular:     Rate and Rhythm: Normal rate and regular rhythm.     Pulses: Normal pulses.     Heart sounds: Normal heart sounds. No murmur heard.    No friction rub. No gallop.  Pulmonary:     Effort: Pulmonary effort is normal.  No respiratory distress.     Breath sounds: Normal breath sounds. No stridor. No wheezing, rhonchi or rales.  Chest:     Chest wall: No tenderness.   Musculoskeletal:        General: Normal range of motion.     Cervical back: Normal range of motion and neck supple.   Skin:    General: Skin is warm and dry.     Capillary Refill: Capillary refill takes less than 2 seconds.     Coloration: Skin is not jaundiced or pale.     Findings: No bruising, erythema, lesion or rash.   Neurological:     General: No focal deficit present.     Mental Status: She is alert and oriented to person, place, and time. Mental status is at baseline.   Psychiatric:        Mood  and Affect: Mood normal.        Behavior: Behavior normal.        Thought Content: Thought content normal.        Judgment: Judgment normal.     Results for orders placed or performed in visit on 05/14/23  Basic metabolic panel   Collection Time: 05/14/23  1:33 PM  Result Value Ref Range   Glucose 137 (H) 70 - 99 mg/dL   BUN 17 8 - 27 mg/dL   Creatinine, Ser 2.13 0.57 - 1.00 mg/dL   eGFR 89 >08 MV/HQI/6.96   BUN/Creatinine Ratio 23 12 - 28   Sodium 143 134 - 144 mmol/L   Potassium 3.8 3.5 - 5.2 mmol/L   Chloride 103 96 - 106 mmol/L   CO2 22 20 - 29 mmol/L   Calcium 9.5 8.7 - 10.3 mg/dL      Assessment & Plan:   Problem List Items Addressed This Visit       Cardiovascular and Mediastinum   Essential hypertension - Primary   Under good control on current regimen. Continue current regimen. Continue to monitor. Call with any concerns. Refills given. Labs drawn today.       Relevant Medications   lisinopril  (ZESTRIL ) 5 MG tablet   Other Relevant Orders   Comprehensive metabolic panel with GFR     Endocrine   IFG (impaired fasting glucose)   Rechecking labs today. Await results. Treat as needed.       Relevant Orders   Bayer DCA Hb A1c Waived   Comprehensive metabolic panel with GFR     Other   B12 deficiency   Rechecking labs today. Await results. Treat as needed.       Relevant Orders   CBC with Differential/Platelet   Comprehensive metabolic panel with GFR   B12   Mixed hyperlipidemia   Rechecking labs today. Await results. Treat as needed.       Relevant Medications   lisinopril  (ZESTRIL ) 5 MG tablet   Other Relevant Orders   Comprehensive metabolic panel with GFR   Lipid Panel w/o Chol/HDL Ratio     Follow up plan: Return in about 6 months (around 05/14/2024) for physical.

## 2023-11-13 NOTE — Assessment & Plan Note (Signed)
 Under good control on current regimen. Continue current regimen. Continue to monitor. Call with any concerns. Refills given. Labs drawn today.

## 2023-11-14 ENCOUNTER — Encounter: Payer: Self-pay | Admitting: Family Medicine

## 2023-11-14 ENCOUNTER — Ambulatory Visit: Payer: Self-pay | Admitting: Family Medicine

## 2023-11-14 LAB — COMPREHENSIVE METABOLIC PANEL WITH GFR
ALT: 22 IU/L (ref 0–32)
AST: 14 IU/L (ref 0–40)
Albumin: 4.1 g/dL (ref 3.9–4.9)
Alkaline Phosphatase: 113 IU/L (ref 44–121)
BUN/Creatinine Ratio: 22 (ref 12–28)
BUN: 16 mg/dL (ref 8–27)
Bilirubin Total: 0.8 mg/dL (ref 0.0–1.2)
CO2: 20 mmol/L (ref 20–29)
Calcium: 9.2 mg/dL (ref 8.7–10.3)
Chloride: 105 mmol/L (ref 96–106)
Creatinine, Ser: 0.72 mg/dL (ref 0.57–1.00)
Globulin, Total: 1.5 g/dL (ref 1.5–4.5)
Glucose: 152 mg/dL — ABNORMAL HIGH (ref 70–99)
Potassium: 3.9 mmol/L (ref 3.5–5.2)
Sodium: 143 mmol/L (ref 134–144)
Total Protein: 5.6 g/dL — ABNORMAL LOW (ref 6.0–8.5)
eGFR: 94 mL/min/{1.73_m2} (ref >59)

## 2023-11-14 LAB — CBC WITH DIFFERENTIAL/PLATELET
Basophils Absolute: 0.1 10*3/uL (ref 0.0–0.2)
Basos: 1 %
EOS (ABSOLUTE): 0.2 10*3/uL (ref 0.0–0.4)
Eos: 3 %
Hematocrit: 42.6 % (ref 34.0–46.6)
Hemoglobin: 14.1 g/dL (ref 11.1–15.9)
Immature Grans (Abs): 0 10*3/uL (ref 0.0–0.1)
Immature Granulocytes: 0 %
Lymphocytes Absolute: 2.4 10*3/uL (ref 0.7–3.1)
Lymphs: 34 %
MCH: 29.4 pg (ref 26.6–33.0)
MCHC: 33.1 g/dL (ref 31.5–35.7)
MCV: 89 fL (ref 79–97)
Monocytes Absolute: 0.6 10*3/uL (ref 0.1–0.9)
Monocytes: 8 %
Neutrophils Absolute: 4 10*3/uL (ref 1.4–7.0)
Neutrophils: 54 %
Platelets: 201 10*3/uL (ref 150–450)
RBC: 4.8 x10E6/uL (ref 3.77–5.28)
RDW: 13.4 % (ref 11.7–15.4)
WBC: 7.2 10*3/uL (ref 3.4–10.8)

## 2023-11-14 LAB — LIPID PANEL W/O CHOL/HDL RATIO
Cholesterol, Total: 199 mg/dL (ref 100–199)
HDL: 48 mg/dL (ref >39)
LDL Chol Calc (NIH): 121 mg/dL — ABNORMAL HIGH (ref 0–99)
Triglycerides: 169 mg/dL — ABNORMAL HIGH (ref 0–149)
VLDL Cholesterol Cal: 30 mg/dL (ref 5–40)

## 2023-11-14 LAB — VITAMIN B12: Vitamin B-12: 451 pg/mL (ref 232–1245)

## 2023-11-23 ENCOUNTER — Other Ambulatory Visit: Payer: Self-pay

## 2023-12-01 ENCOUNTER — Other Ambulatory Visit: Payer: Self-pay

## 2023-12-02 ENCOUNTER — Other Ambulatory Visit: Payer: Self-pay

## 2024-05-20 ENCOUNTER — Ambulatory Visit: Admitting: Family Medicine

## 2024-06-03 ENCOUNTER — Other Ambulatory Visit: Payer: Self-pay

## 2024-06-16 ENCOUNTER — Ambulatory Visit: Admitting: Family Medicine

## 2024-07-24 ENCOUNTER — Ambulatory Visit: Admitting: Family Medicine
